# Patient Record
Sex: Male | Born: 1980
Health system: Southern US, Community
[De-identification: ages and names within clinical notes are randomized; demographics above are authoritative.]

## PROBLEM LIST (undated history)

## (undated) DIAGNOSIS — E119 Type 2 diabetes mellitus without complications: Secondary | ICD-10-CM

## (undated) DIAGNOSIS — N183 Chronic kidney disease, stage 3 unspecified: Secondary | ICD-10-CM

## (undated) DIAGNOSIS — I152 Hypertension secondary to endocrine disorders: Secondary | ICD-10-CM

## (undated) DIAGNOSIS — E1159 Type 2 diabetes mellitus with other circulatory complications: Secondary | ICD-10-CM

## (undated) DIAGNOSIS — I10 Essential (primary) hypertension: Secondary | ICD-10-CM

---

## 2009-03-22 ENCOUNTER — Observation Stay: Payer: Self-pay | Admitting: Internal Medicine

## 2009-11-06 ENCOUNTER — Emergency Department: Payer: Self-pay | Admitting: Emergency Medicine

## 2012-07-24 ENCOUNTER — Inpatient Hospital Stay: Payer: Self-pay | Admitting: Internal Medicine

## 2012-07-24 LAB — URINALYSIS, COMPLETE
Bacteria: NONE SEEN
Leukocyte Esterase: NEGATIVE
Nitrite: NEGATIVE
Protein: >=500
Specific Gravity: 1.029 (ref 1.003–1.030)
WBC UR: 2 /HPF (ref 0–5)

## 2012-07-24 LAB — CBC
HCT: 42.7 % (ref 40.0–52.0)
HGB: 14.9 g/dL (ref 13.0–18.0)
MCV: 70 fL — ABNORMAL LOW (ref 80–100)
RDW: 16.4 % — ABNORMAL HIGH (ref 11.5–14.5)
WBC: 9 10*3/uL (ref 3.8–10.6)

## 2012-07-24 LAB — COMPREHENSIVE METABOLIC PANEL
Alkaline Phosphatase: 101 U/L (ref 50–136)
Anion Gap: 14 (ref 7–16)
BUN: 27 mg/dL — ABNORMAL HIGH (ref 7–18)
Calcium, Total: 9.7 mg/dL (ref 8.5–10.1)
Creatinine: 1.93 mg/dL — ABNORMAL HIGH (ref 0.60–1.30)
EGFR (African American): 53 — ABNORMAL LOW
EGFR (Non-African Amer.): 45 — ABNORMAL LOW
Glucose: 514 mg/dL (ref 65–99)
Osmolality: 300 (ref 275–301)
SGPT (ALT): 56 U/L (ref 12–78)
Sodium: 136 mmol/L (ref 136–145)
Total Protein: 9.2 g/dL — ABNORMAL HIGH (ref 6.4–8.2)

## 2012-07-24 LAB — TROPONIN I
Troponin-I: 0.11 ng/mL — ABNORMAL HIGH
Troponin-I: 0.13 ng/mL — ABNORMAL HIGH

## 2012-07-24 LAB — LIPASE, BLOOD: Lipase: 226 U/L (ref 73–393)

## 2012-07-25 LAB — CBC WITH DIFFERENTIAL/PLATELET
Basophil #: 0.1 10*3/uL (ref 0.0–0.1)
Eosinophil #: 0.2 10*3/uL (ref 0.0–0.7)
Eosinophil %: 2 %
HCT: 37.5 % — ABNORMAL LOW (ref 40.0–52.0)
Lymphocyte #: 2.5 10*3/uL (ref 1.0–3.6)
Lymphocyte %: 26.2 %
Monocyte %: 6 %
Neutrophil %: 65.2 %
Platelet: 173 10*3/uL (ref 150–440)
RBC: 5.32 10*6/uL (ref 4.40–5.90)
WBC: 9.7 10*3/uL (ref 3.8–10.6)

## 2012-07-25 LAB — BASIC METABOLIC PANEL
Anion Gap: 14 (ref 7–16)
BUN: 28 mg/dL — ABNORMAL HIGH (ref 7–18)
Co2: 22 mmol/L (ref 21–32)
Creatinine: 1.92 mg/dL — ABNORMAL HIGH (ref 0.60–1.30)
EGFR (African American): 53 — ABNORMAL LOW
EGFR (Non-African Amer.): 46 — ABNORMAL LOW
Glucose: 441 mg/dL — ABNORMAL HIGH (ref 65–99)
Sodium: 139 mmol/L (ref 136–145)

## 2012-07-25 LAB — CK TOTAL AND CKMB (NOT AT ARMC)
CK, Total: 273 U/L — ABNORMAL HIGH (ref 35–232)
CK-MB: 1.6 ng/mL (ref 0.5–3.6)

## 2012-07-25 LAB — LIPID PANEL
Cholesterol: 228 mg/dL — ABNORMAL HIGH (ref 0–200)
HDL Cholesterol: 23 mg/dL — ABNORMAL LOW (ref 40–60)
Triglycerides: 2425 mg/dL — ABNORMAL HIGH (ref 0–200)

## 2012-07-25 LAB — FERRITIN: Ferritin (ARMC): 473 ng/mL — ABNORMAL HIGH (ref 8–388)

## 2012-07-25 LAB — TROPONIN I: Troponin-I: 0.14 ng/mL — ABNORMAL HIGH

## 2012-07-25 LAB — HEMOGLOBIN A1C: Hemoglobin A1C: 11.7 % — ABNORMAL HIGH (ref 4.2–6.3)

## 2012-07-25 LAB — IRON: Iron: 45 ug/dL — ABNORMAL LOW (ref 65–175)

## 2012-07-26 LAB — BASIC METABOLIC PANEL
BUN: 20 mg/dL — ABNORMAL HIGH (ref 7–18)
Calcium, Total: 9.1 mg/dL (ref 8.5–10.1)
Chloride: 106 mmol/L (ref 98–107)
Creatinine: 1.53 mg/dL — ABNORMAL HIGH (ref 0.60–1.30)
EGFR (African American): >60
EGFR (Non-African Amer.): >60
Glucose: 352 mg/dL — ABNORMAL HIGH (ref 65–99)
Potassium: 4.2 mmol/L (ref 3.5–5.1)
Sodium: 142 mmol/L (ref 136–145)

## 2012-07-27 LAB — BASIC METABOLIC PANEL
Anion Gap: 10 (ref 7–16)
BUN: 19 mg/dL — ABNORMAL HIGH (ref 7–18)
Calcium, Total: 8.5 mg/dL (ref 8.5–10.1)
Chloride: 106 mmol/L (ref 98–107)
Co2: 25 mmol/L (ref 21–32)
EGFR (African American): >60
Sodium: 141 mmol/L (ref 136–145)

## 2013-09-13 ENCOUNTER — Ambulatory Visit: Payer: Self-pay | Admitting: Internal Medicine

## 2014-09-16 DIAGNOSIS — E1165 Type 2 diabetes mellitus with hyperglycemia: Secondary | ICD-10-CM

## 2014-09-16 DIAGNOSIS — IMO0002 Reserved for concepts with insufficient information to code with codable children: Secondary | ICD-10-CM | POA: Insufficient documentation

## 2014-09-16 DIAGNOSIS — E1129 Type 2 diabetes mellitus with other diabetic kidney complication: Secondary | ICD-10-CM | POA: Insufficient documentation

## 2014-09-16 DIAGNOSIS — Z794 Long term (current) use of insulin: Secondary | ICD-10-CM | POA: Insufficient documentation

## 2014-09-27 IMAGING — US US RENAL KIDNEY
1 series · 14 of 25 positions shown · non-contrast
Comparison: none

REASON FOR EXAM: flank pain renal insufficiency
COMMENTS:

PROCEDURE:     US  - US KIDNEY  - September 13, 2013 [DATE]
RESULT:     Bilateral renal ultrasound dated 09/13/2013
TECHNIQUE: Grayscale, duplex color flow Doppler real-time sonographic
imaging obtained with image documentation.

[Series 1: us renal kidney · 0.27mm/px · 14 of 32 slices shown]
[im 1/32]
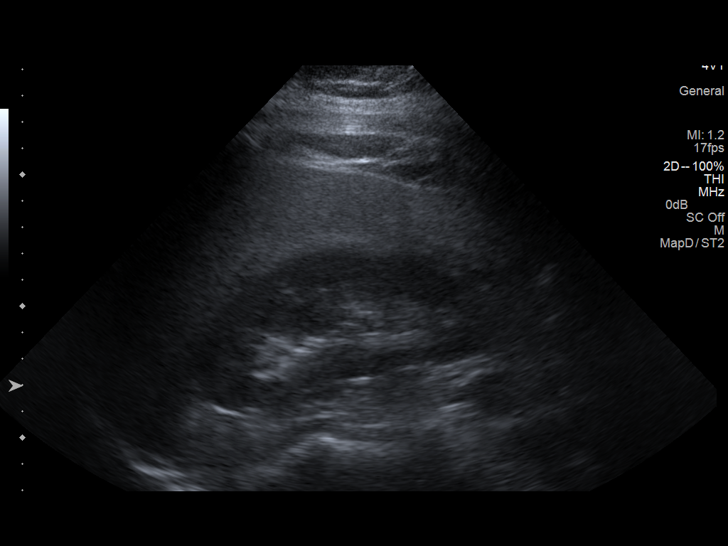
[im 3/32]
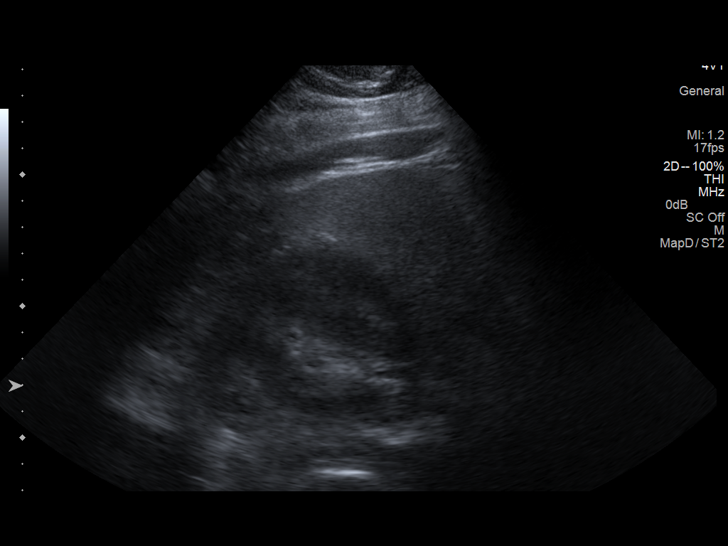
[im 6/32]
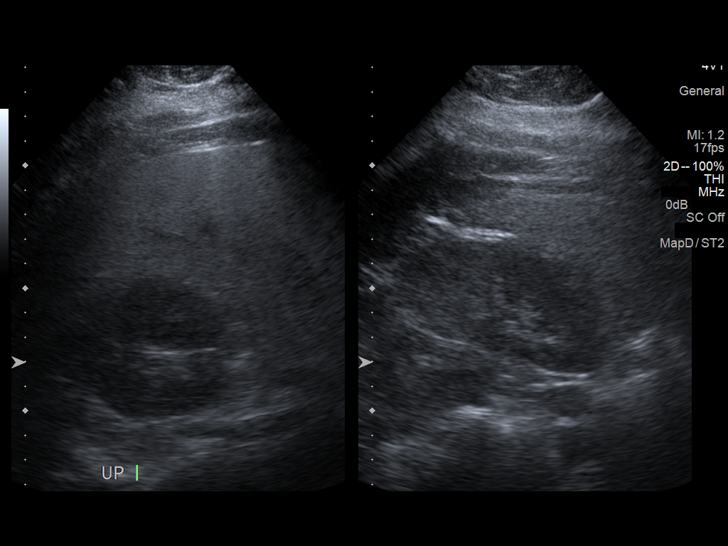
[im 8/32]
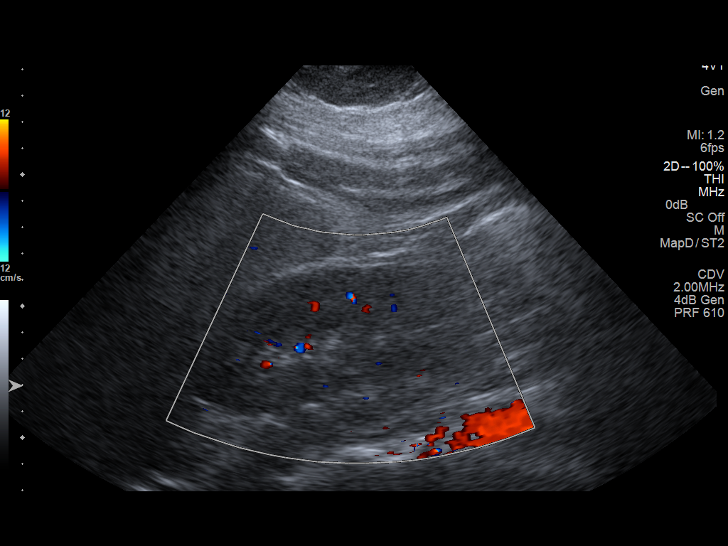
[im 11/32]
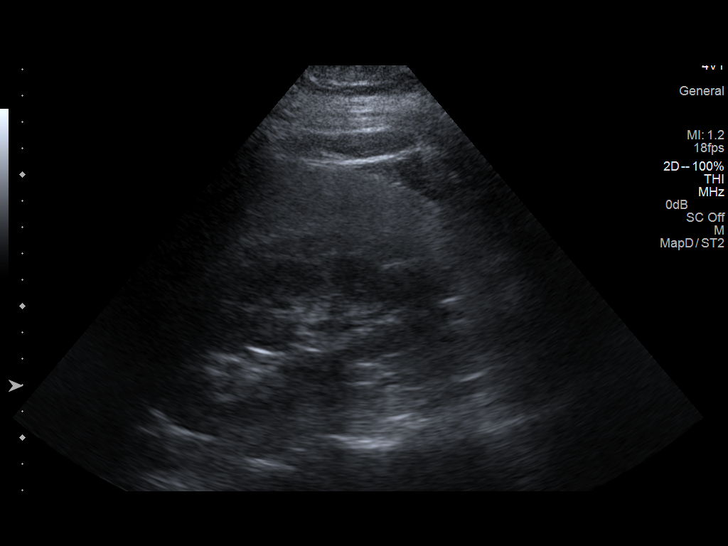
[im 12/32]
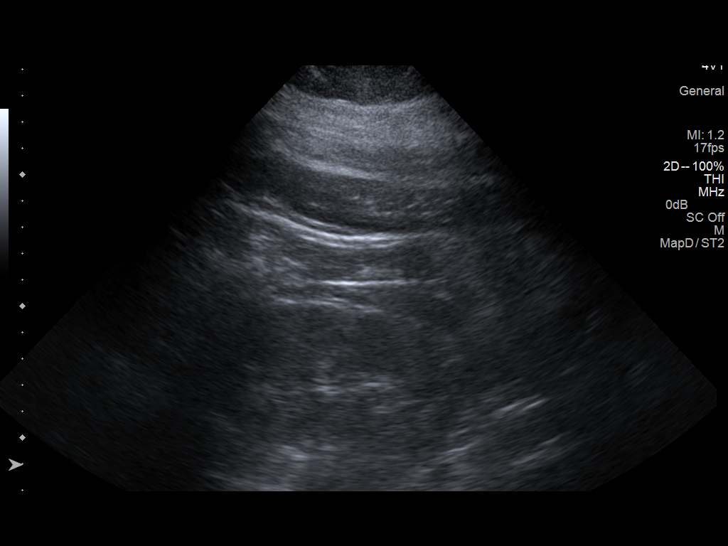
[im 15/32]
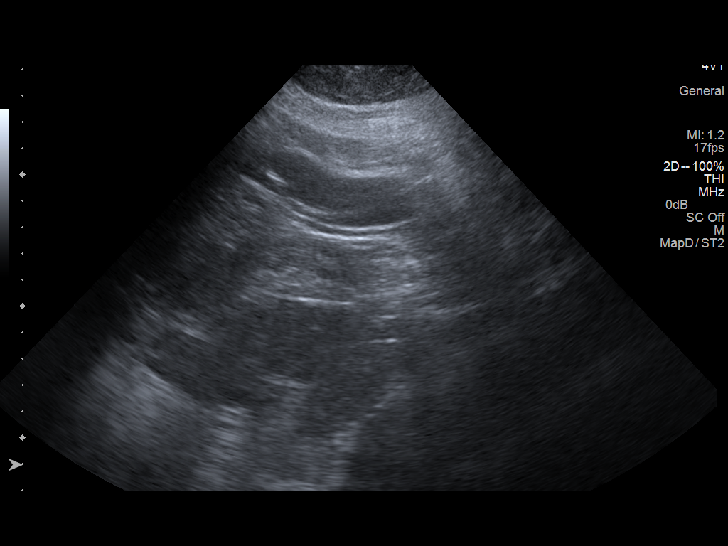
[im 17/32]
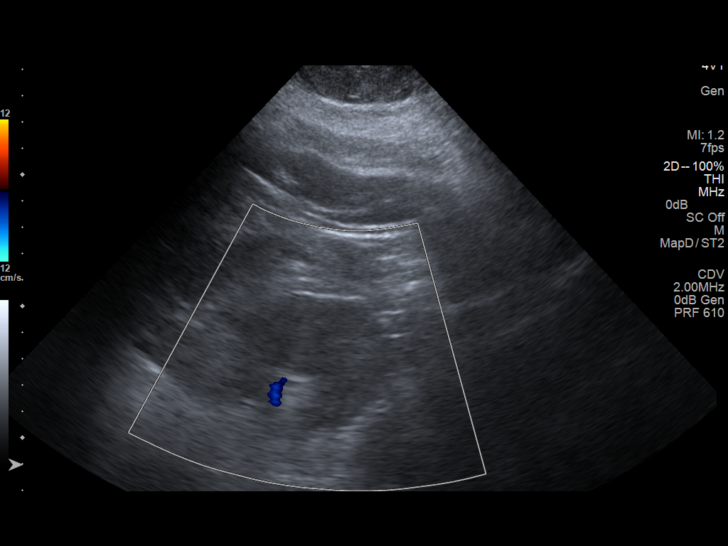
[im 20/32]
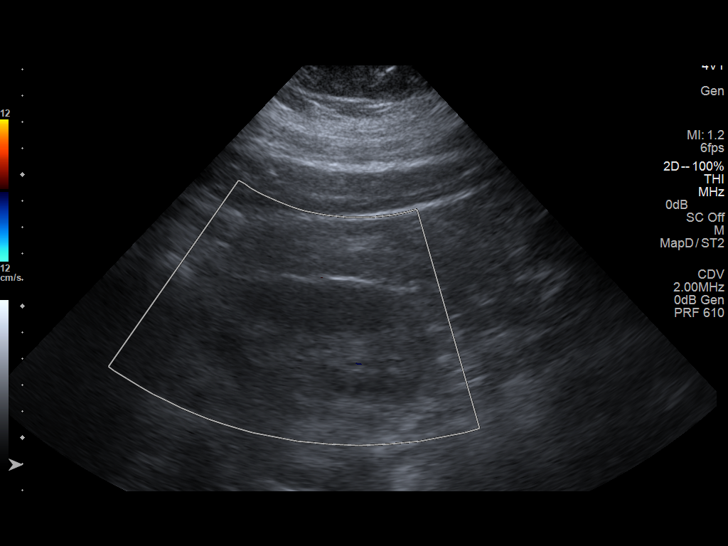
[im 21/32]
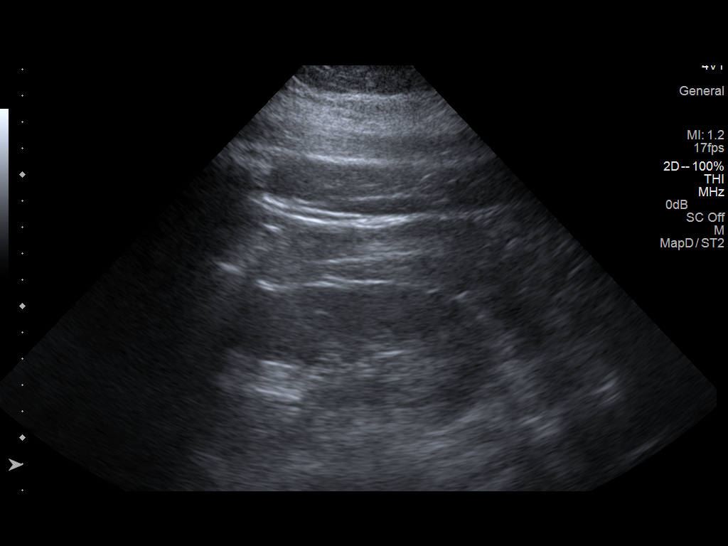
[im 24/32]
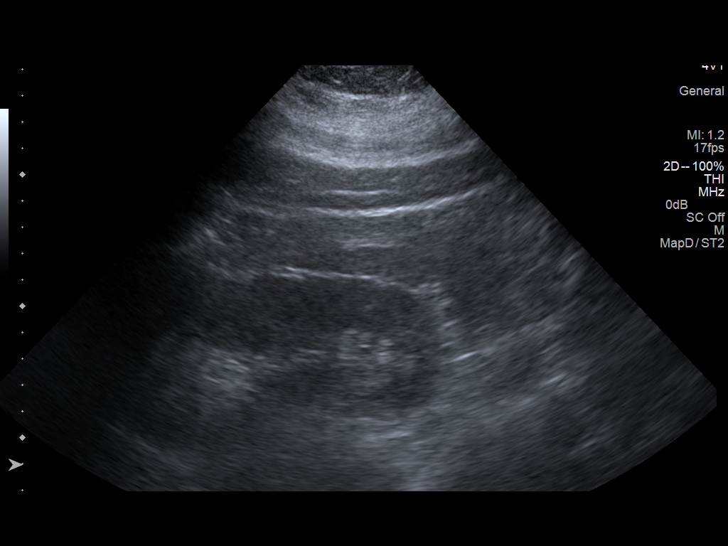
[im 26/32]
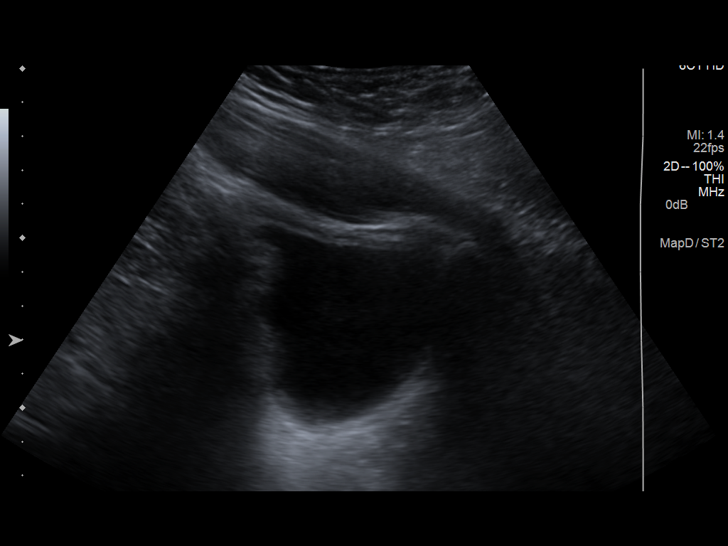
[im 29/32]
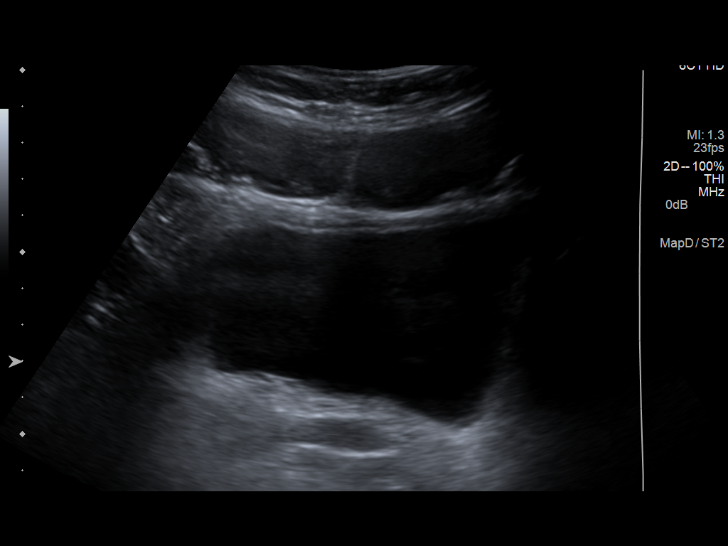
[im 32/32]
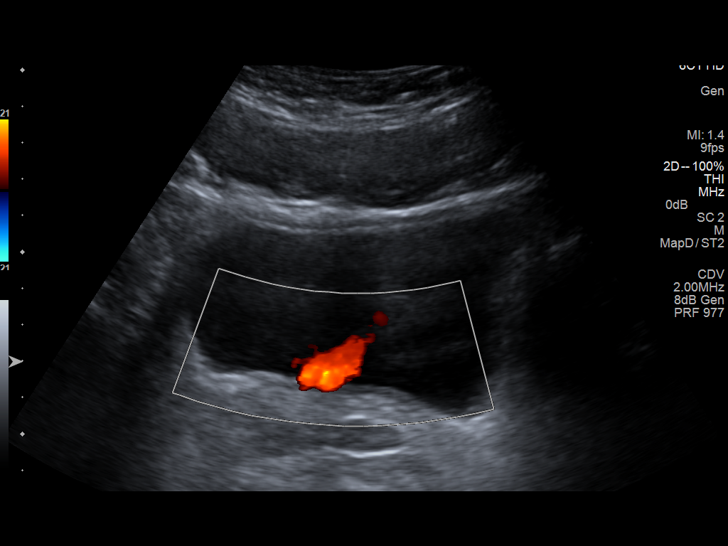

[14 of 25 positions shown; findings below may reference images not displayed]

FINDINGS: The right kidney measures 11.8 x 6.6 x 6.4 cm and the left 10.3 x
5.4 x 5.5 cm. There is appropriate cortical medullary differentiation
without evidence of solid or cystic masses, calculi, or hydronephrosis.
Bilateral ureteral jets are demonstrated.
IMPRESSION: Unremarkable renal ultrasound.

## 2015-02-28 NOTE — Consult Note (Signed)
PATIENT NAME:  Bobby Montgomery, Bobby Montgomery MR#:  [id number] DATE OF BIRTH:  23-Jul-1981  DATE OF CONSULTATION:  07/27/2012  REFERRING PHYSICIAN: Fulton Reek, MD   CONSULTING PHYSICIAN:  A. Lavone Orn, MD  CHIEF COMPLAINT: Uncontrolled diabetes.    HISTORY OF PRESENT ILLNESS: The patient is a 34 year old male who was admitted on 07/24/2012 with nausea and severe hyperglycemia. He was found to have an initial blood sugar over 500 as well as acute renal failure with a creatinine of 1.98, up from a baseline value of 1.6. He had been diagnosed with diabetes in April 2013 and had been taking Januvia 100 mg daily. Upon hospitalization, he was initiated on basal insulin with Lantus as well as a NovoLog sliding scale. He was given IV fluids for his renal failure. His blood sugars initially remained high, however, have improved in the last 48 hours. Blood sugars at this time are in the range of 290 to 325. His current diabetes regimen is Levemir 50 units at bedtime and glimepiride 4 mg daily and NovoLog insulin sliding scale. He continues to have renal insufficiency, and his creatinine has decreased to baseline at 1.6 (eGFR greater than 60).   PAST MEDICAL HISTORY:  1. Morbid obesity (BMI 48). 2. Diabetes mellitus, diagnosis April 2013.  3. Hypertension.  4. Obstructive sleep apnea.   INPATIENT MEDICATIONS:  1. Levemir 50 units daily.  2. Norvasc 10 mg daily.  3. HCTZ 12.5 mg daily.  4. Toprol-XL 100 mg b.i.d.  5. Pantoprazole 40 mg daily.  6. Diovan 320 mg daily.  7. Aspirin 81 mg daily.  8. Heparin subcutaneous 5000 units every 12 hours.  9. Glimepiride 4 mg daily.  10. NovoLog sliding scale before meals and at bedtime.   FAMILY HISTORY: Not known, the patient is adopted.   SOCIAL HISTORY: The patient is married and has one child. He is not employed. He denies alcohol and tobacco use.    REVIEW OF SYSTEMS: HEENT: No blurred vision. No sore throat. NECK: No neck pain. No dysphagia. CHEST: No chest  pain or palpitation. PULMONARY: No cough or shortness of breath. ABDOMEN: No abdominal pain. Nausea has resolved. Appetite is good. EXTREMITIES: Denies leg swelling. SKIN: Denies rash or pruritus. ENDOCRINE: Denies heat or cold intolerance. NEUROLOGIC: Denies headaches or falls. HEMATOLOGIC: Denies easy bruisability or recent bleeding.   PHYSICAL EXAMINATION:  VITAL SIGNS: Temperature 97.9, pulse 83, respirations 20, blood pressure 143/86, pulse oximetry 98% on room air. Height 75 inches, weight 390 pounds, BMI 48.8.   GENERAL: Morbidly obese African American male in no acute distress.   HEENT: Extraocular movements are intact. Oropharynx is clear. Mucous membranes are moist.   NECK: Supple. No appreciable thyromegaly.   CARDIAC: Regular rate and rhythm. No carotid bruit.   LUNGS: Clear to auscultation bilaterally. No wheeze or rhonchi.   ABDOMEN: Diffusely soft, nontender, nondistended.   SKIN: No rash. Acanthosis nigricans is present on the neck.   EXTREMITIES: No edema is present. Normal motor tone.   NEUROLOGIC: No sensory deficits appreciated. Normal strength throughout.   PSYCHIATRIC: Alert and oriented x3, calm, and cooperative.   LABORATORY DATA: Glucose 316, creatinine 1.61, BUN 19, potassium 3.7, eGFR greater than 60, calcium 8.5, hematocrit 37.2.    ASSESSMENT: 34 year old male with morbid obesity, hypertension, obstructive sleep apnea, and uncontrolled type 2 diabetes.   RECOMMENDATIONS:  1. Recommending switching to a b.i.d. mixed insulin. We will start NovoLog 70/30 mix 50 units b.i.d. at meals. This regimen should give better 24-hour control than  a once daily basal dose. 2. He was advised to monitor blood sugars at least twice daily. He assured me he has a glucometer and is knowledgeable about how to use it.  3. He would benefit from weight loss and a low carbohydrate diet.  4. He would benefit from diabetes education, this can be arranged as an outpatient.  5. We  will arrange clinic follow-up in one week.  6. Okay to discharge from a diabetes standpoint. He will need prescriptions for NovoLog 70/30 (vial) as well as insulin syringes.   Thank you for the kind request for consultation.  ____________________________ A. Lavone Orn, MD ams:cbb D: 07/27/2012 14:18:13 ET T: 07/27/2012 15:02:25 ET JOB#: [id number]  cc: A. Lavone Orn, MD, <Dictator> Sherlon Handing MD ELECTRONICALLY SIGNED 07/27/2012 16:11

## 2015-02-28 NOTE — Consult Note (Signed)
Chief Complaint and History:   Referring Physician Dr. Judithann SheenSparks    Chief Complaint Uncontrolled diabetes   Allergies:  No Known Allergies:   Assessment/Plan:   Assessment/Plan 34 yo M diagnosed with diabetes in 02/2012 admitted with severe hyperglycemia, sugars >500, without ketoacidosis, and acute renal failure. He is now on Levemir 50 units qHS and sugars today are in the 290-325 range. He was seen, examined, and chart was reviewed.  A/ 1. Uncontrolled type 2 diabetes 2. Morbid obesity  P/ 1. Will DC the Levemir and replace with NovoLog Mix (VIAL) 50 units sq bid to be taken before breakfast and supper. 2. Needs diabetes education which can be arranged as an out-patient. 3. Counseled pt to check his sugar bid. 4. Will arrange out patient follow-up next week. Patient to bring glucometer to clinic appointments. OK to DC home from my standpoint with rxn for NovoLog 70/30 mix VIAL and insulin syringes.  Full consult to be dictated.   Electronic Signatures: Raj JanusSolum, Yehoshua Vitelli M (MD)  (Signed 16-Sep-13 14:08)  Authored: Chief Complaint and History, ALLERGIES, Assessment/Plan   Last Updated: 16-Sep-13 14:08 by Raj JanusSolum, Krystan Northrop M (MD)

## 2015-02-28 NOTE — H&P (Signed)
PATIENT NAME:  Bobby Montgomery, Christoph MR#:  [id number] DATE OF BIRTH:  05/13/1981  DATE OF ADMISSION:  07/24/2012  PRIMARY CARE PHYSICIAN: Aram BeechamJeffrey Sparks, M.D.   History obtained from patient, old records have been reviewed. Imaging studies and EKG personally reviewed. The case was discussed with the ER physician Dr. Dorothea GlassmanPaul Malinda.   CHIEF COMPLAINT: Nausea and chest pain.   HISTORY OF PRESENTING ILLNESS: The patient is a 34 year old male patient with history of diabetes mellitus, hypertension, morbid obesity, and obstructive sleep apnea who presents to the Emergency Room complaining of two days of nausea and uncontrolled blood sugars. The patient's blood sugars were greater than 500 on arrival to the Emergency Room and he received 10 units of insulin again and still persistently at 468 at this time. He also has acute renal failure with creatinine of 1.98 with baseline creatinine being normal. The patient's troponin has been elevated at 0.11 with blood pressure elevated at 199/103. He had some chest pain on Wednesday, which lasted two minutes, midsternal, nonradiating, no aggravating or alleviating factors, not associated with any shortness of breath, palpitations, or blackouts. He has had decreased appetite, decreased oral intake, increased frequency of urination, which has been clear. No dysuria, diarrhea, abdominal pain, or fever. He has been compliant with his medications. He mentions that he was recently started on these medications earlier this year. He had to go in for a  recheck of his blood sugars to Dr. Judithann SheenSparks in a week.   PAST MEDICAL HISTORY:  1. Diabetes mellitus type 2  2. Hypertension.  3. Obstructive sleep apnea. 4. Morbid obesity.   REVIEW OF SYSTEMS: CONSTITUTIONAL: Complains of fatigue and weakness. EYES: No blurred vision, pain, or redness. ENT: No tinnitus, ear pain, or hearing loss. RESPIRATORY: No cough, wheeze, or hemoptysis. CARDIOVASCULAR: Complained of chest pain on Wednesday,  which has resolved. No orthopnea, edema, or arrhythmia. GI: Complains of nausea. No vomiting, diarrhea, abdominal pain, or hematemesis. GU: No dysuria or hematuria.  He has urinary frequency and polyuria. GU:  No sores or discharge. ENDOCRINE: Has polyuria. No thyroid problems. HEMATOLOGIC/LYMPHATIC: No anemia, easy bruising, bleeding. INTEGUMENT: No acne, rash, or lesions. MUSCULOSKELETAL: No low back pain or joint swelling or redness. NEUROLOGIC: No numbness, weakness, or dysarthria. PSYCH: No anxiety or depression.   HOME MEDICATIONS:  1. Amlodipine 10 mg oral once a day.  2. Lisinopril 20 mg oral once a day.  3. Catapres 0.1 mg oral 3 times a day.  4. Metoprolol succinate 25 mg oral once a day.  5. Aspirin 81 mg oral once a day.  6. The patient mentions that he is on Januvia but dosage is unknown at this time.   FAMILY HISTORY: The patient is adopted and family history could not be obtained.   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 No smoking. No alcohol. No illicit drugs.   ALLERGIES: No known drug allergies.   PHYSICAL EXAMINATION:  VITAL SIGNS: Temperature 98.1, pulse 114, blood pressure 199/103, saturating 98% on room air.   GENERAL: Morbidly obese African American male patient sitting up in bed, comfortable, conversational, cooperative with exam.   PSYCHIATRIC: Alert and oriented times three. Mood and affect appropriate. Judgment intact.   HEENT: Atraumatic, normocephalic. Oral mucosa moist and pink. External ears and nose normal. No pallor. No icterus. Pupils bilaterally equal and reactive to light.   NECK: Supple. No thyromegaly. No palpable lymph nodes. Trachea midline. No carotid bruit or JVD.   CARDIOVASCULAR:  S1, S2, tachycardic without any murmurs. Peripheral pulses 2+. No edema.   RESPIRATORY: Normal work of breathing. Clear to auscultation on both sides.   GI: Soft abdomen, nontender. Bowel sounds present. No  hepatosplenomegaly palpable. Distended secondary to obesity.   SKIN: Warm and dry. No petechiae, rash, or ulcers.   MUSCULOSKELETAL: No joint swelling, redness, or effusion of the large joints. No spinal tenderness.   NEUROLOGICAL: Motor strength five out of five in upper and lower extremities. Sensation to fine touch intact all over. Cranial nerves II-XII intact.   LYMPHATIC: No cervical lymphadenopathy.   LABORATORY, DIAGNOSTIC, AND RADIOLOGICAL DATA: Laboratory studies show glucose of 503, 504, 503, and 478. BUN 27, creatinine 1.93, sodium 136, potassium 3.9, GFR of 43. AST, ALT, and alkaline phosphatase normal. Troponin 0.11, WBC 9, hemoglobin 14.9, platelets 197. MCV 70. Urinalysis shows 1+ ketones, bicarbonate 25, anion gap normal at 14. EKG shows normal sinus rhythm, left atrial abnormality, tachycardia. No acute ST-T wave elevation. No acute changes compared to prior EKG.   ASSESSMENT AND PLAN:  1. Elevated troponin of 0.11 with brief chest pain of two minutes on Wednesday. The patient does have hypertension and diabetes as risk factors, although he is young. We will check a CK to start with to see if the patient has any concomitant rhabdomyolysis which could be causing the elevated troponin. EKG has no changes. The patient is presently chest pain free. Two more sets of cardiac enzymes. The patient is on a beta blocker and aspirin which will be continued.  2. Acute renal failure secondary to uncontrolled diabetes, decreased oral intake, and increased diuresis from the glucose. We will start the patient on IV fluid resuscitation and follow the acute renal failure.  3. Hypertensive urgency or emergency. If the patient does have NSTEMI this would be emergency. We will have to hold lisinopril as the patient has acute renal failure. We will continue the amlodipine and metoprolol along with clonidine the patient is on. The patient did miss his pills secondary to nausea. We will start him on IV  medications p.r.n. to control his blood pressure. His blood pressure medication needs to be titrated up if blood pressure is still uncontrolled during the hospital stay.  4. Uncontrolled diabetes mellitus. The patient has a recent diagnosis of diabetes. He is on oral medications which are unknown at this time. With his blood sugars being so high he will need insulin. We will start him on Lantus 20 units at this time along with sliding scale. The Lantus can be titrated up. The patient will benefit from metformin but we will have to hold off at this time secondary to acute renal failure. The patient will also be referred for diabetes education.  5. Deep vein thrombosis prophylaxis with heparin.  6. CODE STATUS: FULL CODE.   TIME SPENT: Time spent today on this case was 65 minutes with more than 50% of the time spent in coordination of care.    ____________________________ Molinda Bailiff. Keana Dueitt, MD srs:bjt D: 07/24/2012 14:33:03 ET T: 07/24/2012 15:13:04 ET JOB#: [id number]  cc: Wardell Heath R. Doil Kamara, MD, <Dictator> Duane Lope. Judithann Sheen, MD Orie Fisherman MD ELECTRONICALLY SIGNED 07/24/2012 23:02

## 2015-02-28 NOTE — Discharge Summary (Signed)
PATIENT NAME:  Bobby Montgomery, [name]:  [id number] DATE OF BIRTH:  May 10, 1981  DATE OF ADMISSION:  07/24/2012 DATE OF DISCHARGE:  07/27/2012  REASON FOR ADMISSION: Nausea with chest pain.   HISTORY OF PRESENT ILLNESS: Please see the dictated history of present illness done by Dr. Elpidio AnisSudini on 07/24/2012.  PAST MEDICAL HISTORY:  1. Morbid obesity.  2. Type 2 diabetes.  3. Accelerated hypertension.  4. Obstructive sleep apnea.   MEDICATIONS ON ADMISSION: Please see admission note.   ALLERGIES: No known drug allergies.   SOCIAL HISTORY, FAMILY HISTORY AND REVIEW OF SYSTEMS: As per admission note.   PHYSICAL EXAMINATION: The patient was in no acute distress. Vital signs were remarkable for a blood pressure of 199/103 with a heart rate of 114. He was afebrile. HEENT exam was unremarkable. Neck was supple without JVD. Lungs were clear. Cardiac exam revealed a rapid rate with a regular rhythm. Normal S1 and S2. Abdomen was soft and nontender. Extremities: Trace edema. Neurologic exam was grossly nonfocal.   LABORATORY DATA: Laboratory data revealed a glucose of 503.   HOSPITAL COURSE: The patient was admitted with uncontrolled diabetes and hyperglycemia with dehydration and acute renal failure from his dehydration. He was also noted to have a mildly elevated troponin. The patient was seen in consultation by endocrinology. He was also seen in consultation by cardiology. He was felt to have elevated troponin due to demand ischemia from his accelerated hypertension, morbid obesity, and dehydration. There was no evidence of myocardial infarction. The patient was maximized on his blood pressure regimen. He was started on insulin. He was given fluids for hydration. His renal function improved. His sugars improved as well. He tolerated insulin well. Diabetic teaching took place during the hospitalization. He denied further chest pain. He was up and ambulating without difficulty. Echocardiogram done in the  hospital was unremarkable except for some moderate concentric left ventricular hypertrophy. By 07/27/2012, the patient was stable and ready for discharge.   DISCHARGE DIAGNOSES:  1. Uncontrolled diabetes with hyperglycemia.  2. Accelerated hypertension.  3. Dehydration with acute renal failure, resolved.  4. Morbid obesity.  5. Obstructive sleep apnea.   DISCHARGE MEDICATIONS:  1. Metoprolol succinate 100 mg p.o. b.i.d.  2. Aspirin 81 mg p.o. daily.  3. Colace 100 mg p.o. b.i.d.  4. Protonix 40 mg p.o. daily.  5. Ambien 5 mg p.o. at bedtime p.r.n. sleep.  6. Norvasc 10 mg p.o. daily.  7. Amaryl 4 mg p.o. daily.  8. Diovan 320 mg p.o. daily.  9. Hydrochlorothiazide 12.5 mg p.o. daily.  10. NovoLog 70/30 mix 50 units subcutaneous b.i.d.   FOLLOW-UP PLANS AND APPOINTMENTS:  1. The patient was discharged on a low sodium carbohydrate controlled ADA diet.  2. He will stop his Januvia and Tribenzor.  3. He will follow up with Dr. Tedd SiasSolum and myself in the office within one week's time, sooner if needed.   ____________________________ Duane LopeJeffrey D. Judithann SheenSparks, MD jds:ap D: 08/06/2012 09:54:18 ET T: 08/07/2012 11:04:07 ET JOB#: [id number]  cc: Duane LopeJeffrey D. Judithann SheenSparks, MD, <Dictator> Bobby Montgomery Bobby Montgomery Bobby Viereck MD ELECTRONICALLY SIGNED 08/09/2012 21:30

## 2016-12-07 ENCOUNTER — Emergency Department
Admission: EM | Admit: 2016-12-07 | Discharge: 2016-12-07 | Disposition: A | Discharge status: Home / Self Care | Payer: BLUE CROSS/BLUE SHIELD | Attending: Emergency Medicine | Admitting: Emergency Medicine

## 2016-12-07 ENCOUNTER — Encounter: Payer: Self-pay | Admitting: Emergency Medicine

## 2016-12-07 DIAGNOSIS — R05 Cough: Secondary | ICD-10-CM | POA: Diagnosis present

## 2016-12-07 DIAGNOSIS — E119 Type 2 diabetes mellitus without complications: Secondary | ICD-10-CM | POA: Insufficient documentation

## 2016-12-07 DIAGNOSIS — I10 Essential (primary) hypertension: Secondary | ICD-10-CM | POA: Diagnosis not present

## 2016-12-07 DIAGNOSIS — J111 Influenza due to unidentified influenza virus with other respiratory manifestations: Secondary | ICD-10-CM | POA: Insufficient documentation

## 2016-12-07 HISTORY — DX: Type 2 diabetes mellitus without complications: E11.9

## 2016-12-07 HISTORY — DX: Essential (primary) hypertension: I10

## 2016-12-07 MED ORDER — FLUTICASONE PROPIONATE 50 MCG/ACT NA SUSP: 2.0000 | Freq: Every day | NASAL | 0 refills | Status: AC

## 2016-12-07 MED ORDER — ACETAMINOPHEN 325 MG PO TABS
650.0000 mg | ORAL_TABLET | Freq: Once | ORAL | Status: AC
  Administered 2016-12-07: 650 mg via ORAL
  Filled 2016-12-07: qty 2

## 2016-12-07 MED ORDER — PROMETHAZINE-DM 6.25-15 MG/5ML PO SYRP: 5.0000 mL | ORAL_SOLUTION | Freq: Four times a day (QID) | ORAL | 0 refills | Status: AC | PRN

## 2016-12-07 MED ORDER — OSELTAMIVIR PHOSPHATE 75 MG PO CAPS: 75.0000 mg | ORAL_CAPSULE | Freq: Two times a day (BID) | ORAL | 0 refills | Status: DC

## 2016-12-07 NOTE — ED Provider Notes (Signed)
[facility] Emergency Department Provider Note  ____________________________________________  Time seen: Approximately 4:55 PM  I have reviewed the triage vital signs and the nursing notes.   HISTORY  Chief Complaint Cough    HPI Bobby Montgomery is a 36 y.o. male , NAD, presents to the emergency department for evaluation of 2 day history of flulike symptoms. Patient states he began to feel ill day before yesterday and notes worsening of symptoms today. Has had fevers, chills, body aches. Has also had a mild cough with chest congestion, nasal congestion and runny nose. Denies sore throat, sinus pressure, ear pain. Has had no abdominal pain, vomiting or diarrhea but does note mild nausea this morning. Has had a decreased appetite as well. Has been able tolerate fluids by mouth without any episodes of nausea or emesis. States his daughter was ill with flu last week and responded well to Tamiflu. Patient currently took a dose of NyQuil last night but no other medications since that time.   Past Medical History:  Diagnosis Date  . Diabetes mellitus without complication (HCC)   . Hypertension     There are no active problems to display for this patient.   History reviewed. No pertinent surgical history.  Prior to Admission medications   Medication Sig Start Date End Date Taking? Authorizing Provider  fluticasone (FLONASE) 50 MCG/ACT nasal spray Place 2 sprays into both nostrils daily. 12/07/16   Sampson Self L Edsel Shives, PA-C  oseltamivir (TAMIFLU) 75 MG capsule Take 1 capsule (75 mg total) by mouth 2 (two) times daily. 12/07/16   Hulen Mandler L Cayla Wiegand, PA-C  promethazine-dextromethorphan (PROMETHAZINE-DM) 6.25-15 MG/5ML syrup Take 5 mLs by mouth 4 (four) times daily as needed for cough. 12/07/16   Zaylon Bossier L Alessandria Henken, PA-C    Allergies Patient has no known allergies.  History reviewed. No pertinent family history.  Social History Social History  Substance Use Topics  . Smoking  status: Never Smoker  . Smokeless tobacco: Never Used  . Alcohol use No     Review of Systems  Constitutional: Positive fever, chills, fatigue, decreased appetite. ENT: Positive nasal congestion, runny nose. No sore throat, sinus pressure, ear pain. Cardiovascular: No chest pain. Respiratory: Positive cough and chest congestion. No shortness of breath. No wheezing.  Gastrointestinal: Positive nausea without vomiting. No abdominal pain.   No diarrhea. Musculoskeletal: Negative for back pain.  Skin: Negative for rash. Neurological: Negative for headaches, focal weakness or numbness. 10-point ROS otherwise negative.  ____________________________________________   PHYSICAL EXAM:  VITAL SIGNS: ED Triage Vitals  Enc Vitals Group     BP 12/07/16 1621 (!) 143/91     Pulse Rate 12/07/16 1621 84     Resp 12/07/16 1621 20     Temp 12/07/16 1621 (!) 100.5 F (38.1 C)     Temp Source 12/07/16 1621 Oral     SpO2 12/07/16 1621 96 %     Weight 12/07/16 1622 (!) 380 lb (172.4 kg)     Height 12/07/16 1622 6\' 2"  (1.88 m)     Head Circumference --      Peak Flow --      Pain Score --      Pain Loc --      Pain Edu? --      Excl. in GC? --      Constitutional: Alert and oriented. Well appearing and in no acute distress. Eyes: Conjunctivae are normal without icterus, injection or discharge. Head: Atraumatic. ENT:      Ears: TMs  visual loss bilaterally with mild serous effusion but no bulging, erythema or perforation.      Nose: Mild congestion with trace clear rhinorrhea. Bilateral turbinates are injected with mild edema.      Mouth/Throat: Mucous membranes are moist. Pharynx without erythema, swelling, exudate. Uvula is midline. Airway is patent. Clear postnasal drainage. Neck: Supple with full range of motion. Hematological/Lymphatic/Immunilogical: No cervical lymphadenopathy. Cardiovascular: Normal rate, regular rhythm. Normal S1 and S2.  Good peripheral circulation. Respiratory:  Normal respiratory effort without tachypnea or retractions. Lungs CTAB with breath sounds noted in all lung fields. No wheeze, rhonchi, rales Neurologic:  Normal speech and language. No gross focal neurologic deficits are appreciated.  Skin:  Skin is warm, dry and intact. No rash noted. Psychiatric: Mood and affect are normal. Speech and behavior are normal. Patient exhibits appropriate insight and judgement.   ____________________________________________   LABS  None ____________________________________________  EKG  None ____________________________________________  RADIOLOGY  None ____________________________________________    PROCEDURES  Procedure(s) performed: None   Procedures   Medications  acetaminophen (TYLENOL) tablet 650 mg (650 mg Oral Given 12/07/16 1710)     ____________________________________________   INITIAL IMPRESSION / ASSESSMENT AND PLAN / ED COURSE  Pertinent labs & imaging results that were available during my care of the patient were reviewed by me and considered in my medical decision making (see chart for details).     Patient's diagnosis is consistent with Influenza due to close contact with family member with positive influenza testing and physical exam and history suggest clinical diagnosis of influenza. Patient was given acetaminophen while in the emergency department to help with body aches and low-grade temperature. Patient will be discharged home with prescriptions for Flonase, Tamiflu and promethazine DM to take as directed.  Patient is to follow up with [facility] if symptoms persist past this treatment course. Patient is given ED precautions to return to the ED for any worsening or new symptoms.   ____________________________________________  FINAL CLINICAL IMPRESSION(S) / ED DIAGNOSES  Final diagnoses:  Influenza      NEW MEDICATIONS STARTED DURING THIS VISIT:  Discharge Medication List as of 12/07/2016  5:22  PM    START taking these medications   Details  fluticasone (FLONASE) 50 MCG/ACT nasal spray Place 2 sprays into both nostrils daily., Starting Sat 12/07/2016, Print    oseltamivir (TAMIFLU) 75 MG capsule Take 1 capsule (75 mg total) by mouth 2 (two) times daily., Starting Sat 12/07/2016, Print    promethazine-dextromethorphan (PROMETHAZINE-DM) 6.25-15 MG/5ML syrup Take 5 mLs by mouth 4 (four) times daily as needed for cough., Starting Sat 12/07/2016, Print             Ernestene Kiel Pitkin, PA-C 12/07/16 1731    Emily Filbert, MD 12/07/16 [phone]

## 2016-12-07 NOTE — Discharge Instructions (Signed)
Rest. Push fluids like water and gatorade. Bland diet as tolerated.   Alternate Tylenol and Motrin as needed.

## 2016-12-07 NOTE — ED Triage Notes (Signed)
Pt reports cough and chest congestion, fevers and body aches for the past 2 days.  No meds given. No flu shot. Has had sick contacts

## 2016-12-07 NOTE — ED Notes (Signed)
Pt verbalized understanding of discharge instructions. NAD at this time. 

## 2017-04-24 DIAGNOSIS — N183 Chronic kidney disease, stage 3 unspecified: Secondary | ICD-10-CM | POA: Insufficient documentation

## 2017-04-24 DIAGNOSIS — E1169 Type 2 diabetes mellitus with other specified complication: Secondary | ICD-10-CM | POA: Insufficient documentation

## 2017-04-24 DIAGNOSIS — E669 Obesity, unspecified: Secondary | ICD-10-CM

## 2017-04-24 DIAGNOSIS — I10 Essential (primary) hypertension: Secondary | ICD-10-CM | POA: Insufficient documentation

## 2017-04-24 DIAGNOSIS — K219 Gastro-esophageal reflux disease without esophagitis: Secondary | ICD-10-CM | POA: Insufficient documentation

## 2017-05-01 ENCOUNTER — Ambulatory Visit: Payer: BLUE CROSS/BLUE SHIELD | Attending: Internal Medicine

## 2017-05-01 DIAGNOSIS — Z6841 Body Mass Index (BMI) 40.0 and over, adult: Secondary | ICD-10-CM | POA: Diagnosis not present

## 2017-05-01 DIAGNOSIS — R809 Proteinuria, unspecified: Secondary | ICD-10-CM | POA: Insufficient documentation

## 2017-05-01 DIAGNOSIS — I10 Essential (primary) hypertension: Secondary | ICD-10-CM | POA: Diagnosis not present

## 2017-05-01 DIAGNOSIS — R0683 Snoring: Secondary | ICD-10-CM | POA: Insufficient documentation

## 2017-05-28 ENCOUNTER — Emergency Department
Admission: EM | Admit: 2017-05-28 | Discharge: 2017-05-28 | Disposition: A | Discharge status: Home / Self Care | Payer: BLUE CROSS/BLUE SHIELD | Attending: Emergency Medicine | Admitting: Emergency Medicine

## 2017-05-28 ENCOUNTER — Encounter: Payer: Self-pay | Admitting: Emergency Medicine

## 2017-05-28 DIAGNOSIS — Z79899 Other long term (current) drug therapy: Secondary | ICD-10-CM | POA: Insufficient documentation

## 2017-05-28 DIAGNOSIS — I10 Essential (primary) hypertension: Secondary | ICD-10-CM | POA: Diagnosis not present

## 2017-05-28 DIAGNOSIS — L600 Ingrowing nail: Secondary | ICD-10-CM | POA: Diagnosis not present

## 2017-05-28 DIAGNOSIS — E119 Type 2 diabetes mellitus without complications: Secondary | ICD-10-CM | POA: Diagnosis not present

## 2017-05-28 DIAGNOSIS — M79674 Pain in right toe(s): Secondary | ICD-10-CM | POA: Diagnosis present

## 2017-05-28 MED ORDER — CLINDAMYCIN HCL 300 MG PO CAPS: 300.0000 mg | ORAL_CAPSULE | Freq: Three times a day (TID) | ORAL | 0 refills | Status: AC

## 2017-05-28 NOTE — ED Triage Notes (Signed)
Presents with pain to right great toe area  Denies any injury but thinks it is an ingrown nail

## 2017-05-28 NOTE — ED Provider Notes (Signed)
[facility] Emergency Department Provider Note   ____________________________________________   I have reviewed the triage vital signs and the nursing notes.   HISTORY  Chief Complaint Toe Pain    HPI Bobby Montgomery is a 36 y.o. male presented to emergency department with right great toe pain related to ingrown toenail. Patient reports that her nail has been worsening over the last several weeks and now has become more painful with walking and general mobility activities. He notes pain with increased swelling and appears infected. Patient is a long-time diabetic and his blood sugar is well-maintained. He denies history of neuropathy. Patient denies fever, chills, headache, vision changes, chest pain, chest tightness, shortness of breath, abdominal pain, nausea and vomiting.  Past Medical History:  Diagnosis Date  . Diabetes mellitus without complication (HCC)   . Hypertension     There are no active problems to display for this patient.   History reviewed. No pertinent surgical history.  Prior to Admission medications   Medication Sig Start Date End Date Taking? Authorizing Provider  clindamycin (CLEOCIN) 300 MG capsule Take 1 capsule (300 mg total) by mouth 3 (three) times daily. 05/28/17 06/07/17  Terese Heier M, PA-C  fluticasone (FLONASE) 50 MCG/ACT nasal spray Place 2 sprays into both nostrils daily. 12/07/16   Hagler, Jami L, PA-C  oseltamivir (TAMIFLU) 75 MG capsule Take 1 capsule (75 mg total) by mouth 2 (two) times daily. 12/07/16   Hagler, Jami L, PA-C  promethazine-dextromethorphan (PROMETHAZINE-DM) 6.25-15 MG/5ML syrup Take 5 mLs by mouth 4 (four) times daily as needed for cough. 12/07/16   Hagler, Jami L, PA-C    Allergies Patient has no known allergies.  No family history on file.  Social History Social History  Substance Use Topics  . Smoking status: Never Smoker  . Smokeless tobacco: Never Used  . Alcohol use No    Review of  Systems Constitutional: Negative for fever/chills Eyes: No visual changes. ENT:  Negative for sore throat and for difficulty swallowing Cardiovascular: Denies chest pain. Respiratory: Denies cough. Denies shortness of breath. Gastrointestinal: No abdominal pain.  No nausea, vomiting, diarrhea. Genitourinary: Negative for dysuria. Musculoskeletal: Negative for back pain. Skin: Negative for rash. Right great toe ingrown toenail with erythema, swelling. Neurological: Negative for headaches.  Negative focal weakness or numbness. Negative for loss of consciousness. Able to ambulate. ____________________________________________   PHYSICAL EXAM:  VITAL SIGNS: ED Triage Vitals  Enc Vitals Group     BP 05/28/17 1622 (!) 145/74     Pulse Rate 05/28/17 1622 72     Resp 05/28/17 1622 18     Temp 05/28/17 1622 99 F (37.2 C)     Temp Source 05/28/17 1622 Oral     SpO2 05/28/17 1622 96 %     Weight --      Height --      Head Circumference --      Peak Flow --      Pain Score 05/28/17 1608 3     Pain Loc --      Pain Edu? --      Excl. in GC? --     Constitutional: Alert and oriented. Well appearing and in no acute distress.  Head: Normocephalic and atraumatic. Eyes: Conjunctivae are normal. PERRL.  Cardiovascular: Normal rate, regular rhythm. Normal distal pulses. Respiratory: Normal respiratory effort.  Musculoskeletal: Nontender with normal range of motion in all extremities. Neurologic: Normal speech and language.  Skin:  Skin is warm, dry and intact.  No rash noted. Right great toe ingrown toenail with erythema, swelling, appearance of infection with pus pocket. Psychiatric: Mood and affect are normal.  ____________________________________________   LABS (all labs ordered are listed, but only abnormal results are displayed)  Labs Reviewed - No data to  display ____________________________________________  EKG None ____________________________________________  RADIOLOGY None ____________________________________________   PROCEDURES  Procedure(s) performed: no    Critical Care performed: no ____________________________________________   INITIAL IMPRESSION / ASSESSMENT AND PLAN / ED COURSE  Pertinent labs & imaging results that were available during my care of the patient were reviewed by me and considered in my medical decision making (see chart for details).  Patient presents to emergency department with right great toe pain secondary to ingrown toenail. History and physical exam are reassuring the toenail is ingrown with developing infection. Patient has a history of diabetes with ball managed blood sugar levels. Discussed with the patient that it is more appropriate for podiatry to manage the ingrown toenail to ensure appropriate wound management following the procedure. Patient and family present verbalized understanding of rationale. Patient given clindamycin for antibiotic coverage and will be provided a referral for podiatry for continued care. Patient informed of clinical course, understand medical decision-making process, and agree with plan.  Patient was advised to follow up with podiatry and was also advised to return to the emergency department for symptoms that change or worsen.     ____________________________________________   FINAL CLINICAL IMPRESSION(S) / ED DIAGNOSES  Final diagnoses:  Ingrown right big toenail       NEW MEDICATIONS STARTED DURING THIS VISIT:  Discharge Medication List as of 05/28/2017  5:48 PM    START taking these medications   Details  clindamycin (CLEOCIN) 300 MG capsule Take 1 capsule (300 mg total) by mouth 3 (three) times daily., Starting Wed 05/28/2017, Until Sat 06/07/2017, Print         Note:  This document was prepared using Dragon voice recognition software and may  include unintentional dictation errors.    Clois Comber, PA-C 05/28/17 1949    Rockne Menghini, MD 06/02/17 [phone]

## 2017-05-28 NOTE — Discharge Instructions (Signed)
Follow-up with podiatry regarding continued care of right great toe ingrown toenail. Take prescription medication as directed. You may perform Epsom salt soaks 2-3 times a day.

## 2017-05-28 NOTE — ED Notes (Signed)
Pt presents to ED with c/o pain to R great toe, pt states the thinks his toe nail is in grown. Pt states started bleeding last night. Pt is noted to have some swelling around the toe nail to R great toe at this time.

## 2017-06-02 ENCOUNTER — Ambulatory Visit (INDEPENDENT_AMBULATORY_CARE_PROVIDER_SITE_OTHER): Payer: BLUE CROSS/BLUE SHIELD | Admitting: Podiatry

## 2017-06-02 ENCOUNTER — Encounter: Payer: Self-pay | Admitting: Podiatry

## 2017-06-02 DIAGNOSIS — I10 Essential (primary) hypertension: Secondary | ICD-10-CM | POA: Insufficient documentation

## 2017-06-02 DIAGNOSIS — L600 Ingrowing nail: Secondary | ICD-10-CM | POA: Diagnosis not present

## 2017-06-02 DIAGNOSIS — M549 Dorsalgia, unspecified: Secondary | ICD-10-CM | POA: Insufficient documentation

## 2017-06-02 DIAGNOSIS — L03031 Cellulitis of right toe: Secondary | ICD-10-CM

## 2017-06-02 DIAGNOSIS — I878 Other specified disorders of veins: Secondary | ICD-10-CM | POA: Insufficient documentation

## 2017-06-02 NOTE — Progress Notes (Signed)
Subjective:     Patient ID: Bobby LaiCourtney Constancio, male   DOB: 1980-11-21, 36 y.o.   MRN: [id number]  HPI this patient presents the office with chief complaint of a painful infected ingrowing toenail, right.  He states that the nail has been painful and deformed for years but has recently become infected.  He says that he went to the emergency room and was prescribed clindamycin to take orally.  He was also told to present to  the office today for definitive evaluation and treatment of this ingrown toenail.  He says the nail is painful walking and wearing his shoes.  He says that this toenail on the right big toe was injured previously been a football event.   Review of Systems     Objective:   Physical Exam GENERAL APPEARANCE: Alert, conversant. Appropriately groomed. No acute distress.  VASCULAR: Pedal pulses are  [facility]  Capillary refill time is immediate to all digits,  Normal temperature gradient.  Digital hair growth is present bilateral  NEUROLOGIC: sensation is normal to 5.07 monofilament at 5/5 sites bilateral.  Light touch is intact bilateral, Muscle strength normal.  MUSCULOSKELETAL: acceptable muscle strength, tone and stability bilateral.  Intrinsic muscluature intact bilateral.  Rectus appearance of foot and digits noted bilateral.  NAIL  Marked incurvation noted along the lateral border of the right great toe.  There is pus abscess and granulation tissue noted along the lateral border of the right great toe. DERMATOLOGIC: skin color, texture, and turgor are within normal limits.  No preulcerative lesions or ulcers  are seen, no interdigital maceration noted.  No open lesions present.  Digital nails are asymptomatic. No drainage noted.      Assessment:     Paronychia right great toe  Ingrown toenail right hallux.    Plan:     IE  Nail surgery  Treatment options and alternatives discussed.  Recommended permanent phenol matrixectomy and patient agreed.  Right  hallux  was prepped with alcohol and a toe block of 5cc of 2% lidocaine plain was administered in a digital toe block. .  The toe was then prepped with betadine solution .  The offending nail border was then excised and matrix tissue exposed.  Phenol was then applied to the matrix tissue followed by an alcohol wash.  Antibiotic ointment and a dry sterile dressing was applied.  The patient was dispensed instructions for aftercare. Patient was instructed to continue on his antibiotics.  Return to clinic in one week for future evaluation and treatment.     Helane GuntherGregory Pearlean Sabina DPM

## 2017-06-12 ENCOUNTER — Encounter: Payer: Self-pay | Admitting: Podiatry

## 2017-06-12 ENCOUNTER — Ambulatory Visit (INDEPENDENT_AMBULATORY_CARE_PROVIDER_SITE_OTHER): Payer: Self-pay | Admitting: Podiatry

## 2017-06-12 DIAGNOSIS — Z09 Encounter for follow-up examination after completed treatment for conditions other than malignant neoplasm: Secondary | ICD-10-CM

## 2017-06-12 NOTE — Progress Notes (Signed)
This patient returns to the office following nail surgery one week ago.  The patient says toe has been soaked and bandaged as directed.  There has been improvement of the toe since the surgery has been performed. The patient presents for continued evaluation and treatment.  GENERAL APPEARANCE: Alert, conversant. Appropriately groomed. No acute distress.  VASCULAR: Pedal pulses palpable at  DP and PT bilateral.  Capillary refill time is immediate to all digits,  Normal temperature gradient.    NEUROLOGIC: sensation is normal to 5.07 monofilament at 5/5 sites bilateral.  Light touch is intact bilateral, Muscle strength normal.  MUSCULOSKELETAL: acceptable muscle strength, tone and stability bilateral.  Intrinsic muscluature intact bilateral.  Rectus appearance of foot and digits noted bilateral.   DERMATOLOGIC: skin color, texture, and turgor are within normal limits.  No preulcerative lesions or ulcers  are seen, no interdigital maceration noted.   NAILS  There is necrotic tissue along the nail groove  In the absence of redness swelling and pain.  DX  S/p nail surgery  ROV  Home instructions were discussed.  Patient to call the office if there are any questions or concerns.   Vernecia Umble DPM   

## 2018-05-06 ENCOUNTER — Other Ambulatory Visit: Payer: Self-pay | Admitting: Nephrology

## 2018-05-06 DIAGNOSIS — N183 Chronic kidney disease, stage 3 unspecified: Secondary | ICD-10-CM

## 2018-05-06 DIAGNOSIS — N179 Acute kidney failure, unspecified: Secondary | ICD-10-CM

## 2018-05-13 ENCOUNTER — Ambulatory Visit
Admission: RE | Admit: 2018-05-13 | Discharge: 2018-05-13 | Disposition: A | Discharge status: Home / Self Care | Payer: BLUE CROSS/BLUE SHIELD | Source: Ambulatory Visit | Attending: Nephrology | Admitting: Nephrology

## 2018-05-13 DIAGNOSIS — N179 Acute kidney failure, unspecified: Secondary | ICD-10-CM

## 2018-05-13 DIAGNOSIS — N183 Chronic kidney disease, stage 3 unspecified: Secondary | ICD-10-CM

## 2018-05-28 ENCOUNTER — Ambulatory Visit: Payer: BLUE CROSS/BLUE SHIELD | Admitting: Podiatry

## 2018-06-01 ENCOUNTER — Ambulatory Visit: Payer: BLUE CROSS/BLUE SHIELD | Admitting: Podiatry

## 2018-06-01 ENCOUNTER — Encounter: Payer: Self-pay | Admitting: Podiatry

## 2018-06-01 DIAGNOSIS — B351 Tinea unguium: Secondary | ICD-10-CM

## 2018-06-01 DIAGNOSIS — E119 Type 2 diabetes mellitus without complications: Secondary | ICD-10-CM | POA: Diagnosis not present

## 2018-06-01 DIAGNOSIS — M79675 Pain in left toe(s): Secondary | ICD-10-CM | POA: Diagnosis not present

## 2018-06-01 NOTE — Progress Notes (Signed)
This patient presents the office with chief complaint of painful left big toenail, great toe.  He says it has grown thick and long and is growing into the corner of his nail.  He says there is no drainage, redness or infection noted.  He says that the nails painful walking and wearing his shoes.  Patient had previous nail surgery performed by myself one year ago on his right big toenail.  He presents the office  today for an evaluation and treatment of this condition.  General Appearance  Alert, conversant and in no acute stress.  Vascular  Dorsalis pedis and posterior tibial  pulses are palpable  bilaterally.  Capillary return is within normal limits  bilaterally. Temperature is within normal limits  bilaterally.  Neurologic  Senn-Weinstein monofilament wire test within normal limits  bilaterally. Muscle power within normal limits bilaterally.  Nails Thick disfigured discolored nails with subungual debris  hallux  left great toe. No evidence of bacterial infection or drainage bilaterally.  Orthopedic  No limitations of motion of motion feet .  No crepitus or effusions noted.  No bony pathology or digital deformities noted.  Skin  normotropic skin with no porokeratosis noted bilaterally.  No signs of infections or ulcers noted.    Onychomycosis left hallux toenail.  ROV  Debridement of nail.  RTC 3 months.   Helane GuntherGregory Kohei Antonellis DPM

## 2020-02-03 ENCOUNTER — Ambulatory Visit: Payer: BLUE CROSS/BLUE SHIELD | Admitting: Podiatry

## 2020-02-07 ENCOUNTER — Ambulatory Visit: Payer: BC Managed Care – PPO | Admitting: Podiatry

## 2020-02-07 ENCOUNTER — Other Ambulatory Visit: Payer: Self-pay

## 2020-02-07 ENCOUNTER — Encounter: Payer: Self-pay | Admitting: Podiatry

## 2020-02-07 DIAGNOSIS — E669 Obesity, unspecified: Secondary | ICD-10-CM | POA: Diagnosis not present

## 2020-02-07 DIAGNOSIS — E1169 Type 2 diabetes mellitus with other specified complication: Secondary | ICD-10-CM

## 2020-02-07 DIAGNOSIS — L03031 Cellulitis of right toe: Secondary | ICD-10-CM | POA: Diagnosis not present

## 2020-02-07 NOTE — Progress Notes (Signed)
This patient presents the office with chief complaint of painful right big toe outside border.  He has pain walking and wearing his shoes.  He says it started to be painful this weekend. He presents the office  today for an evaluation and treatment of this condition.  Patient has history of CKD and DM.  General Appearance  Alert, conversant and in no acute stress.  Vascular  Dorsalis pedis and posterior tibial  pulses are palpable  bilaterally.  Capillary return is within normal limits  bilaterally. Temperature is within normal limits  bilaterally.  Neurologic  Senn-Weinstein monofilament wire test within normal limits  bilaterally. Muscle power within normal limits bilaterally.  Nails Thick disfigured discolored nails with subungual debris  hallux  left great toe.  Nail spicule lateral border right hallux with necrotic tissue at distal nail border right hallux No evidence of bacterial infection or drainage bilaterally.  Orthopedic  No limitations of motion of motion feet .  No crepitus or effusions noted.  No bony pathology or digital deformities noted.  Skin  normotropic skin with no porokeratosis noted bilaterally.  No signs of infections or ulcers noted.    Paronychia right hallux.  ROV  Incision and drainage right hallux toenail lateral border.    RTC 4-6 monhs   Helane Gunther DPM

## 2020-02-15 ENCOUNTER — Inpatient Hospital Stay
Admission: EM | Admit: 2020-02-15 | Discharge: 2020-02-17 | DRG: 177 | Disposition: A | Discharge status: Home / Self Care | Payer: BC Managed Care – PPO | Attending: Internal Medicine | Admitting: Internal Medicine

## 2020-02-15 ENCOUNTER — Encounter: Payer: Self-pay | Admitting: Emergency Medicine

## 2020-02-15 ENCOUNTER — Other Ambulatory Visit: Payer: Self-pay

## 2020-02-15 ENCOUNTER — Emergency Department: Payer: BC Managed Care – PPO

## 2020-02-15 DIAGNOSIS — Z7984 Long term (current) use of oral hypoglycemic drugs: Secondary | ICD-10-CM | POA: Diagnosis not present

## 2020-02-15 DIAGNOSIS — I129 Hypertensive chronic kidney disease with stage 1 through stage 4 chronic kidney disease, or unspecified chronic kidney disease: Secondary | ICD-10-CM | POA: Diagnosis present

## 2020-02-15 DIAGNOSIS — E119 Type 2 diabetes mellitus without complications: Secondary | ICD-10-CM

## 2020-02-15 DIAGNOSIS — I872 Venous insufficiency (chronic) (peripheral): Secondary | ICD-10-CM | POA: Diagnosis present

## 2020-02-15 DIAGNOSIS — Z79899 Other long term (current) drug therapy: Secondary | ICD-10-CM

## 2020-02-15 DIAGNOSIS — J9601 Acute respiratory failure with hypoxia: Secondary | ICD-10-CM | POA: Diagnosis present

## 2020-02-15 DIAGNOSIS — E669 Obesity, unspecified: Secondary | ICD-10-CM | POA: Diagnosis present

## 2020-02-15 DIAGNOSIS — U071 COVID-19: Secondary | ICD-10-CM | POA: Diagnosis present

## 2020-02-15 DIAGNOSIS — Z6841 Body Mass Index (BMI) 40.0 and over, adult: Secondary | ICD-10-CM

## 2020-02-15 DIAGNOSIS — R079 Chest pain, unspecified: Secondary | ICD-10-CM

## 2020-02-15 DIAGNOSIS — I248 Other forms of acute ischemic heart disease: Secondary | ICD-10-CM | POA: Diagnosis present

## 2020-02-15 DIAGNOSIS — J1282 Pneumonia due to coronavirus disease 2019: Secondary | ICD-10-CM | POA: Diagnosis present

## 2020-02-15 DIAGNOSIS — N189 Chronic kidney disease, unspecified: Secondary | ICD-10-CM

## 2020-02-15 DIAGNOSIS — I809 Phlebitis and thrombophlebitis of unspecified site: Secondary | ICD-10-CM | POA: Diagnosis present

## 2020-02-15 DIAGNOSIS — F39 Unspecified mood [affective] disorder: Secondary | ICD-10-CM | POA: Diagnosis present

## 2020-02-15 DIAGNOSIS — D509 Iron deficiency anemia, unspecified: Secondary | ICD-10-CM | POA: Diagnosis present

## 2020-02-15 DIAGNOSIS — N179 Acute kidney failure, unspecified: Secondary | ICD-10-CM | POA: Diagnosis present

## 2020-02-15 DIAGNOSIS — E1122 Type 2 diabetes mellitus with diabetic chronic kidney disease: Secondary | ICD-10-CM | POA: Diagnosis present

## 2020-02-15 DIAGNOSIS — I152 Hypertension secondary to endocrine disorders: Secondary | ICD-10-CM | POA: Diagnosis present

## 2020-02-15 DIAGNOSIS — N183 Chronic kidney disease, stage 3 unspecified: Secondary | ICD-10-CM | POA: Diagnosis present

## 2020-02-15 DIAGNOSIS — E1159 Type 2 diabetes mellitus with other circulatory complications: Secondary | ICD-10-CM | POA: Diagnosis present

## 2020-02-15 DIAGNOSIS — Z7982 Long term (current) use of aspirin: Secondary | ICD-10-CM | POA: Diagnosis not present

## 2020-02-15 DIAGNOSIS — E860 Dehydration: Secondary | ICD-10-CM | POA: Diagnosis present

## 2020-02-15 HISTORY — DX: Chronic kidney disease, stage 3 unspecified: N18.30

## 2020-02-15 HISTORY — DX: Type 2 diabetes mellitus without complications: E11.9

## 2020-02-15 HISTORY — DX: Hypertension secondary to endocrine disorders: I15.2

## 2020-02-15 HISTORY — DX: Type 2 diabetes mellitus with other circulatory complications: E11.59

## 2020-02-15 LAB — CBC
HCT: 35.8 % — ABNORMAL LOW (ref 39.0–52.0)
Hemoglobin: 11.7 g/dL — ABNORMAL LOW (ref 13.0–17.0)
MCH: 23.3 pg — ABNORMAL LOW (ref 26.0–34.0)
MCHC: 32.7 g/dL (ref 30.0–36.0)
MCV: 71.2 fL — ABNORMAL LOW (ref 80.0–100.0)
Platelets: 162 10*3/uL (ref 150–400)
RBC: 5.03 MIL/uL (ref 4.22–5.81)
RDW: 17 % — ABNORMAL HIGH (ref 11.5–15.5)
WBC: 6.5 10*3/uL (ref 4.0–10.5)
nRBC: 0 % (ref 0.0–0.2)

## 2020-02-15 LAB — BASIC METABOLIC PANEL
Anion gap: 10 (ref 5–15)
BUN: 42 mg/dL — ABNORMAL HIGH (ref 6–20)
CO2: 27 mmol/L (ref 22–32)
Calcium: 9.1 mg/dL (ref 8.9–10.3)
Chloride: 104 mmol/L (ref 98–111)
Creatinine, Ser: 3.63 mg/dL — ABNORMAL HIGH (ref 0.61–1.24)
GFR calc Af Amer: 23 mL/min — ABNORMAL LOW (ref >60)
GFR calc non Af Amer: 20 mL/min — ABNORMAL LOW (ref >60)
Glucose, Bld: 127 mg/dL — ABNORMAL HIGH (ref 70–99)
Potassium: 4.2 mmol/L (ref 3.5–5.1)
Sodium: 141 mmol/L (ref 135–145)

## 2020-02-15 LAB — LACTIC ACID, PLASMA: Lactic Acid, Venous: 1.1 mmol/L (ref 0.5–1.9)

## 2020-02-15 LAB — TROPONIN I (HIGH SENSITIVITY)
Troponin I (High Sensitivity): 126 ng/L (ref <18)
Troponin I (High Sensitivity): 122 ng/L (ref <18)

## 2020-02-15 LAB — BRAIN NATRIURETIC PEPTIDE: B Natriuretic Peptide: 18 pg/mL (ref 0.0–100.0)

## 2020-02-15 LAB — POC SARS CORONAVIRUS 2 AG: SARS Coronavirus 2 Ag: POSITIVE — AB

## 2020-02-15 LAB — PROCALCITONIN: Procalcitonin: <0.1 ng/mL

## 2020-02-15 MED ORDER — CLONIDINE HCL 0.3 MG/24HR TD PTWK
0.3000 mg | MEDICATED_PATCH | TRANSDERMAL | Status: DC
  Administered 2020-02-16: 09:00:00 0.3 mg via TRANSDERMAL
  Filled 2020-02-15: qty 1

## 2020-02-15 MED ORDER — ASCORBIC ACID 500 MG PO TABS
500.0000 mg | ORAL_TABLET | Freq: Every day | ORAL | Status: DC
  Administered 2020-02-16 – 2020-02-17 (×2): 500 mg via ORAL
  Filled 2020-02-15 (×2): qty 1

## 2020-02-15 MED ORDER — ONDANSETRON HCL 4 MG PO TABS: 4.0000 mg | ORAL_TABLET | Freq: Four times a day (QID) | ORAL | Status: DC | PRN

## 2020-02-15 MED ORDER — HYDRALAZINE HCL 50 MG PO TABS
100.0000 mg | ORAL_TABLET | Freq: Two times a day (BID) | ORAL | Status: DC
  Administered 2020-02-16 – 2020-02-17 (×4): 100 mg via ORAL
  Filled 2020-02-15 (×4): qty 2

## 2020-02-15 MED ORDER — INSULIN ASPART 100 UNIT/ML ~~LOC~~ SOLN
0.0000 [IU] | Freq: Three times a day (TID) | SUBCUTANEOUS | Status: DC
Start: 2020-02-16 — End: 2020-02-17
  Administered 2020-02-16 (×2): 2 [IU] via SUBCUTANEOUS
  Administered 2020-02-16: 17:00:00 1 [IU] via SUBCUTANEOUS
  Administered 2020-02-17: 2 [IU] via SUBCUTANEOUS
  Filled 2020-02-15 (×4): qty 1

## 2020-02-15 MED ORDER — ASPIRIN 81 MG PO CHEW: 324.0000 mg | CHEWABLE_TABLET | Freq: Once | ORAL | Status: DC

## 2020-02-15 MED ORDER — ZINC SULFATE 220 (50 ZN) MG PO CAPS
220.0000 mg | ORAL_CAPSULE | Freq: Every day | ORAL | Status: DC
  Administered 2020-02-16 – 2020-02-17 (×2): 220 mg via ORAL
  Filled 2020-02-15 (×2): qty 1

## 2020-02-15 MED ORDER — SODIUM CHLORIDE 0.9 % IV BOLUS
1000.0000 mL | Freq: Once | INTRAVENOUS | Status: AC
  Administered 2020-02-15: 22:00:00 1000 mL via INTRAVENOUS

## 2020-02-15 MED ORDER — IPRATROPIUM-ALBUTEROL 20-100 MCG/ACT IN AERS
1.0000 | INHALATION_SPRAY | Freq: Four times a day (QID) | RESPIRATORY_TRACT | Status: DC
  Administered 2020-02-16 – 2020-02-17 (×6): 1 via RESPIRATORY_TRACT
  Filled 2020-02-15 (×2): qty 4

## 2020-02-15 MED ORDER — DEXAMETHASONE SODIUM PHOSPHATE 10 MG/ML IJ SOLN
6.0000 mg | INTRAMUSCULAR | Status: DC
  Administered 2020-02-16 (×2): 6 mg via INTRAVENOUS
  Filled 2020-02-15 (×2): qty 1

## 2020-02-15 MED ORDER — METOPROLOL SUCCINATE ER 50 MG PO TB24
100.0000 mg | ORAL_TABLET | Freq: Every day | ORAL | Status: DC
  Administered 2020-02-16 – 2020-02-17 (×2): 100 mg via ORAL
  Filled 2020-02-15 (×3): qty 2

## 2020-02-15 MED ORDER — SODIUM CHLORIDE 0.9 % IV SOLN: INTRAVENOUS | Status: AC

## 2020-02-15 MED ORDER — ACETAMINOPHEN 325 MG PO TABS: 650.0000 mg | ORAL_TABLET | Freq: Four times a day (QID) | ORAL | Status: DC | PRN

## 2020-02-15 MED ORDER — SODIUM CHLORIDE 0.9 % IV SOLN
200.0000 mg | Freq: Once | INTRAVENOUS | Status: AC
  Administered 2020-02-16: 03:00:00 200 mg via INTRAVENOUS
  Filled 2020-02-15: qty 40

## 2020-02-15 MED ORDER — ONDANSETRON HCL 4 MG/2ML IJ SOLN: 4.0000 mg | Freq: Four times a day (QID) | INTRAMUSCULAR | Status: DC | PRN

## 2020-02-15 MED ORDER — ACETAMINOPHEN 500 MG PO TABS
1000.0000 mg | ORAL_TABLET | Freq: Once | ORAL | Status: AC
  Administered 2020-02-15: 20:00:00 1000 mg via ORAL
  Filled 2020-02-15: qty 2

## 2020-02-15 MED ORDER — AMLODIPINE BESYLATE 5 MG PO TABS
10.0000 mg | ORAL_TABLET | Freq: Every day | ORAL | Status: DC
  Administered 2020-02-16 – 2020-02-17 (×2): 10 mg via ORAL
  Filled 2020-02-15 (×2): qty 2

## 2020-02-15 MED ORDER — LAMOTRIGINE 25 MG PO TABS
50.0000 mg | ORAL_TABLET | Freq: Every day | ORAL | Status: DC
  Administered 2020-02-16 – 2020-02-17 (×2): 50 mg via ORAL
  Filled 2020-02-15 (×2): qty 2

## 2020-02-15 MED ORDER — GUAIFENESIN-DM 100-10 MG/5ML PO SYRP
10.0000 mL | ORAL_SOLUTION | ORAL | Status: DC | PRN
  Filled 2020-02-15: qty 10

## 2020-02-15 MED ORDER — HEPARIN SODIUM (PORCINE) 5000 UNIT/ML IJ SOLN
5000.0000 [IU] | Freq: Three times a day (TID) | INTRAMUSCULAR | Status: DC
  Administered 2020-02-16: 5000 [IU] via SUBCUTANEOUS
  Filled 2020-02-15: qty 1

## 2020-02-15 MED ORDER — HYDROCOD POLST-CPM POLST ER 10-8 MG/5ML PO SUER: 5.0000 mL | Freq: Two times a day (BID) | ORAL | Status: DC | PRN

## 2020-02-15 MED ORDER — TRAZODONE HCL 50 MG PO TABS
50.0000 mg | ORAL_TABLET | Freq: Every day | ORAL | Status: DC
  Administered 2020-02-16: 50 mg via ORAL
  Filled 2020-02-15 (×2): qty 1

## 2020-02-15 MED ORDER — SODIUM CHLORIDE 0.9 % IV SOLN
100.0000 mg | Freq: Every day | INTRAVENOUS | Status: DC
  Administered 2020-02-16 – 2020-02-17 (×2): 100 mg via INTRAVENOUS
  Filled 2020-02-15 (×2): qty 20
  Filled 2020-02-15: qty 100

## 2020-02-15 MED ORDER — PANTOPRAZOLE SODIUM 40 MG PO TBEC
40.0000 mg | DELAYED_RELEASE_TABLET | Freq: Every day | ORAL | Status: DC
  Administered 2020-02-16 – 2020-02-17 (×2): 40 mg via ORAL
  Filled 2020-02-15 (×2): qty 1

## 2020-02-15 NOTE — ED Provider Notes (Signed)
Admission discussed with Dr. Allena Katz of the hospitalist service.   Sharyn Creamer, MD 02/15/20 2127

## 2020-02-15 NOTE — H&P (Signed)
History and Physical    Bobby Montgomery QBV:[id number] DOB: 03/14/1981 DOA: 02/15/2020  PCP: System, Pcp Not In  Patient coming from: Home  I have personally briefly reviewed patient's old medical records in [facility]  Chief Complaint: Fevers, chills, body aches, shortness of breath  HPI: Bobby Montgomery is a 39 y.o. male with medical history significant for type 2 diabetes, hypertension, CKD stage III, chronic venous insufficiency with thrombophlebitis who presents to the ED for evaluation of fevers, chills, body aches, and shortness of breath.  Patient reports about 3 to 4 days of fatigue, low energy, decreased appetite and poor oral intake.  He has been having some body aches and generalized weakness.  He reports chills but denies subjective fevers or diaphoresis.  He has had some lightheadedness without syncope or fall.  He reports nausea without emesis.  He had some shortness of breath earlier today but none currently.  He checked his oxygen status at home and was noted to have low oxygen saturation down to 76% on room air.  He denies any chest pain, cough, abdominal pain, dysuria, or diarrhea.  He presented to the ED for further evaluation.  ED Course:  Initial vitals showed BP 163/89, pulse 87, RR 18, temp 101 Fahrenheit, SPO2 90% on room air.  Labs are notable for BUN 42, creatinine 3.63 (last creatinine 2.37 on 01/25/2020), sodium 141, potassium 4.2, bicarb 27, WBC 6.5, hemoglobin 11.7, platelets 162,000, high-sensitivity troponin I 126, procalcitonin <0.10, lactic acid 1.1.  POC SARS-CoV-2 antigen test is positive.  Blood cultures were obtained and pending.  Patient was placed on 2 L supplemental O2 with improvement in oxygen saturation.  He was given 1 L normal saline and Tylenol.  The hospitalist service was consulted to admit for further evaluation and management.  Review of Systems: All systems reviewed and are negative except as documented in history of present illness  above.   Past Medical History:  Diagnosis Date  . CKD (chronic kidney disease), stage III   . Hypertension associated with diabetes (HCC)   . Type 2 diabetes mellitus (HCC)     History reviewed. No pertinent surgical history.  Social History:  reports that he has never smoked. He has never used smokeless tobacco. He reports that he does not drink alcohol. No history on file for drug.  No Known Allergies  Family History  Adopted: Yes     Prior to Admission medications   Medication Sig Start Date End Date Taking? Authorizing Provider  allopurinol (ZYLOPRIM) 100 MG tablet  04/24/17   [provider]  amLODipine (NORVASC) 10 MG tablet Take by mouth. 10/16/15   [provider]  aspirin 81 MG EC tablet  04/25/17   [provider]  calcitRIOL (ROCALTROL) 0.25 MCG capsule  05/08/17   [provider]  cloNIDine (CATAPRES - DOSED IN MG/24 HR) 0.3 mg/24hr patch Place onto the skin. 04/24/17   [provider]  cloNIDine (CATAPRES - DOSED IN MG/24 HR) 0.3 mg/24hr patch Place onto the skin. 01/25/20 01/24/21  [provider]  Elastic Bandages & Supports (RELIEF KNEE) MISC Apply in the morning and remove at night. 01/06/20   [provider]  fluticasone (FLONASE) 50 MCG/ACT nasal spray Place 2 sprays into both nostrils daily. 12/07/16   Hagler, Jami L, PA-C  furosemide (LASIX) 40 MG tablet Take by mouth. 03/04/19 01/24/21  [provider]  glimepiride (AMARYL) 4 MG tablet Take by mouth. 10/16/15 01/24/21  [provider]  hydrALAZINE (APRESOLINE)  100 MG tablet Take 100 mg by mouth 2 (two) times daily. 01/23/20   [provider]  hydrochlorothiazide (HYDRODIURIL) 25 MG tablet Take by mouth. 07/27/12 01/24/21  [provider]  losartan (COZAAR) 100 MG tablet Take by mouth. 10/16/15 01/24/21  [provider]  metFORMIN (GLUCOPHAGE-XR) 500 MG 24 hr tablet metformin ER 500 mg tablet,extended release 24 hr   TAKE 2 TABLETS (1,000 MG TOTAL) BY MOUTH TWO (2) TIMES A DAY.    [provider]  metoprolol succinate (TOPROL-XL) 100 MG 24 hr tablet Take by mouth. 07/27/12   [provider]  minoxidil (LONITEN) 10 MG tablet Take by mouth. 01/25/20 01/24/21  [provider]  oseltamivir (TAMIFLU) 75 MG capsule Take 1 capsule (75 mg total) by mouth 2 (two) times daily. 12/07/16   Hagler, Jami L, PA-C  pantoprazole (PROTONIX) 40 MG tablet pantoprazole 40 mg tablet,delayed release  TAKE 1 TABLET BY MOUTH EVERY DAY 07/27/12   [provider]  promethazine-dextromethorphan (PROMETHAZINE-DM) 6.25-15 MG/5ML syrup Take 5 mLs by mouth 4 (four) times daily as needed for cough. 12/07/16   Hagler, Jami L, PA-C  traZODone (DESYREL) 50 MG tablet Take by mouth. 01/25/20   [provider]    Physical Exam: Vitals:   02/15/20 1945  BP: (!) 163/89  Pulse: 87  Resp: 18  Temp: (!) 101 F (38.3 C)  TempSrc: Oral  SpO2: 90%   Constitutional: Resting in bed with head elevated, NAD, calm, comfortable Eyes: PERRL, lids and conjunctivae normal ENMT: Mucous membranes are moist. Posterior pharynx clear of any exudate or lesions. Neck: normal, supple, no masses. Respiratory: Bibasilar inspiratory crackles. Normal respiratory effort. No accessory muscle use.  Cardiovascular: Regular rate and rhythm, no murmurs / rubs / gallops. No extremity edema. 2+ pedal pulses. Abdomen: no tenderness, no masses palpated. No hepatosplenomegaly. Bowel sounds positive.  Musculoskeletal: no clubbing / cyanosis. No joint deformity upper and lower extremities. Good ROM, no contractures. Normal muscle tone.  Skin: no rashes, lesions, ulcers. No induration Neurologic: CN 2-12 grossly intact. Sensation intact, Strength 5/5 in all 4.  Psychiatric: Normal judgment and insight. Alert and oriented x 3. Normal mood.   Labs on Admission: I have personally reviewed following labs and imaging studies  CBC: Recent  Labs  Lab 02/15/20 1952  WBC 6.5  HGB 11.7*  HCT 35.8*  MCV 71.2*  PLT 162   Basic Metabolic Panel: Recent Labs  Lab 02/15/20 1952  NA 141  K 4.2  CL 104  CO2 27  GLUCOSE 127*  BUN 42*  CREATININE 3.63*  CALCIUM 9.1   GFR: CrCl cannot be calculated (Unknown ideal weight.). Liver Function Tests: No results for input(s): AST, ALT, ALKPHOS, BILITOT, PROT, ALBUMIN in the last 168 hours. No results for input(s): LIPASE, AMYLASE in the last 168 hours. No results for input(s): AMMONIA in the last 168 hours. Coagulation Profile: No results for input(s): INR, PROTIME in the last 168 hours. Cardiac Enzymes: No results for input(s): CKTOTAL, CKMB, CKMBINDEX, TROPONINI in the last 168 hours. BNP (last 3 results) No results for input(s): PROBNP in the last 8760 hours. HbA1C: No results for input(s): HGBA1C in the last 72 hours. CBG: No results for input(s): GLUCAP in the last 168 hours. Lipid Profile: No results for input(s): CHOL, HDL, LDLCALC, TRIG, CHOLHDL, LDLDIRECT in the last 72 hours. Thyroid Function Tests: No results for input(s): TSH, T4TOTAL, FREET4, T3FREE, THYROIDAB in the last 72 hours. Anemia Panel: No results for input(s): VITAMINB12, FOLATE, FERRITIN,  TIBC, IRON, RETICCTPCT in the last 72 hours. Urine analysis:    Component Value Date/Time   COLORURINE Yellow 07/24/2012 1015   APPEARANCEUR Hazy 07/24/2012 1015   LABSPEC 1.029 07/24/2012 1015   PHURINE 5.0 07/24/2012 1015   GLUCOSEU >=500 07/24/2012 1015   HGBUR 2+ 07/24/2012 1015   BILIRUBINUR Negative 07/24/2012 1015   KETONESUR 1+ 07/24/2012 1015   PROTEINUR >=500 07/24/2012 1015   NITRITE Negative 07/24/2012 1015   LEUKOCYTESUR Negative 07/24/2012 1015    Radiological Exams on Admission: DG Chest Port 1 View  Result Date: 02/15/2020 CLINICAL DATA:  Chest pain. EXAM: PORTABLE CHEST 1 VIEW COMPARISON:  November 06, 2009 FINDINGS: Very mild, hazy areas of atelectasis and/or early infiltrate are seen  within the bilateral lung bases and mid left lung. There is no evidence of a pleural effusion or pneumothorax. The cardiac silhouette is mildly enlarged. The visualized skeletal structures are unremarkable. IMPRESSION: Very mild, hazy bibasilar and mid left lung atelectasis and/or early infiltrate. Electronically Signed   By: Virgina Norfolk M.D.   On: 02/15/2020 20:10    EKG: Independently reviewed. Sinus rhythm, nonspecific ST elevation in V2.  When compared to prior EKG in 2010 reciprocal ST changes are noted on V2-V3.  Assessment/Plan Principal Problem:   Acute hypoxemic respiratory failure due to COVID-19 [facility]) Active Problems:   Hypertension associated with diabetes ([place])   Type 2 diabetes mellitus ([place])   Acute kidney injury superimposed on CKD (HCC)  Bobby Montgomery is a 39 y.o. male with medical history significant for type 2 diabetes, hypertension, CKD stage III, chronic venous insufficiency with thrombophlebitis who is admitted for acute hypoxemic respiratory failure due to COVID-19 pneumonitis.  Acute hypoxemic respiratory failure due to COVID-19 pneumonitis: SARS-CoV-2 antigen positive 02/15/2020.  O2 saturation 90% on room air on arrival to the ED.  Chest x-ray shows bibasilar infiltrates. -Start IV remdesivir per pharmacy protocol -Start IV Decadron 6 mg daily -Continue supplemental oxygen as needed and wean off as able -Continue Combivent, incentive spirometer, flutter valve -Antitussives, vitamin C, zinc -Monitor daily inflammatory markers  AKI on CKD stage III: Creatinine up to 3.63 compared to 2.37 on 01/25/2020.  Likely combination of dehydration and medication effect. -1 L normal saline being given in the ED, follow with maintenance IV fluids overnight -Hold home losartan, HCTZ, Metformin, minoxidil, Lasix -Avoid NSAIDs  Elevated troponin: High-sensitivity troponin I 126.  EKG with nonspecific ST changes in V2.  Patient denies any chest pain.  Likely demand ischemia  in setting of acute hypoxic respiratory failure and AKI on CKD stage III.  Cycle cardiac enzymes.  Hypertension: Patient on numerous antihypertensives as an outpatient. -Holding home losartan, HCTZ, minoxidil, Lasix in setting of AKI -Resume amlodipine, hydralazine, Toprol-XL -Continue clonidine patch, hold oral clonidine  Type 2 diabetes: Hemoglobin A1c 6.3 on 01/25/2020.  Holding home Metformin and Amaryl while in hospital.  Continue sensitive SSI and adjust as needed while on steroids.  Chronic venous insufficiency and thrombophlebitis: Chronic and stable, no lower extremity edema at time of admission.  Can place compression stockings if needed.  Mood disorder: Patient reports recently been started on Lamictal and trazodone for unspecified mood disorder.  Will continue both.  DVT prophylaxis: Subcutaneous heparin Code Status: Full code, confirmed with patient Family Communication: Discussed with wife at bedside Disposition Plan: From home, likely discharge to home pending improvement in symptoms, oxygenation status, and need for completion of IV remdesivir course in hospital versus outpatient. Consults called: None Admission status: Inpatient for management  of acute hypoxemic respiratory failure due to COVID-19 pneumonitis.   Bobby Mclean MD [facility]  If 7PM-7AM, please contact night-coverage [email or web address]  02/15/2020, 10:06 PM

## 2020-02-15 NOTE — ED Provider Notes (Signed)
[facility] Emergency Department Provider Note   ____________________________________________   First MD Initiated Contact with Patient 02/15/20 1958     (approximate)  I have reviewed the triage vital signs and the nursing notes.   HISTORY  Chief Complaint Fever, fatigue   HPI Bobby Montgomery is a 39 y.o. male history diabetes hypertension  On roughly Sunday patient started having fevers and chills, body aches especially across his back.  Said some slight shortness of breath today, his oxygen level went down when they measured at home as low as 76% on room air  He reports right now he does not really feel too short of breath just a little bit.  No chest pain.  No nausea or vomiting.  Fatigue however some body aches across his back.  Fevers.  He has been very tired sleeping a lot the last 3 to 5 days  No known Covid exposure.   Past Medical History:  Diagnosis Date  . Diabetes mellitus without complication (HCC)   . Hypertension     Patient Active Problem List   Diagnosis Date Noted  . Paronychia of great toe, right 02/07/2020  . Hypertension 06/02/2017  . Mechanical back pain 06/02/2017  . Morbid obesity (HCC) 06/02/2017  . Venous stasis 06/02/2017  . Diabetes mellitus type 2 in obese (HCC) 04/24/2017  . Essential hypertension 04/24/2017  . Gastroesophageal reflux disease without esophagitis 04/24/2017  . Stage 3 chronic kidney disease 04/24/2017  . Long-term insulin use (HCC) 09/16/2014  . Type II diabetes mellitus with renal manifestations, uncontrolled (HCC) 09/16/2014    History reviewed. No pertinent surgical history.  Prior to Admission medications   Medication Sig Start Date End Date Taking? Authorizing Provider  allopurinol (ZYLOPRIM) 100 MG tablet  04/24/17   [provider]  amLODipine (NORVASC) 10 MG tablet Take by mouth. 10/16/15   [provider]  aspirin 81 MG EC tablet  04/25/17   [provider]    calcitRIOL (ROCALTROL) 0.25 MCG capsule  05/08/17   [provider]  cloNIDine (CATAPRES - DOSED IN MG/24 HR) 0.3 mg/24hr patch Place onto the skin. 04/24/17   [provider]  cloNIDine (CATAPRES - DOSED IN MG/24 HR) 0.3 mg/24hr patch Place onto the skin. 01/25/20 01/24/21  [provider]  Elastic Bandages & Supports (RELIEF KNEE) MISC Apply in the morning and remove at night. 01/06/20   [provider]  fluticasone (FLONASE) 50 MCG/ACT nasal spray Place 2 sprays into both nostrils daily. 12/07/16   Hagler, Jami L, PA-C  furosemide (LASIX) 40 MG tablet Take by mouth. 03/04/19 01/24/21  [provider]  glimepiride (AMARYL) 4 MG tablet Take by mouth. 10/16/15 01/24/21  [provider]  hydrALAZINE (APRESOLINE) 100 MG tablet Take 100 mg by mouth 2 (two) times daily. 01/23/20   [provider]  hydrochlorothiazide (HYDRODIURIL) 25 MG tablet Take by mouth. 07/27/12 01/24/21  [provider]  losartan (COZAAR) 100 MG tablet Take by mouth. 10/16/15 01/24/21  [provider]  metFORMIN (GLUCOPHAGE-XR) 500 MG 24 hr tablet metformin ER 500 mg tablet,extended release 24 hr  TAKE 2 TABLETS (1,000 MG TOTAL) BY MOUTH TWO (2) TIMES A DAY.    [provider]  metoprolol succinate (TOPROL-XL) 100 MG 24 hr tablet Take by mouth. 07/27/12   [provider]  minoxidil (LONITEN) 10 MG tablet Take by mouth. 01/25/20 01/24/21  [provider]  oseltamivir (TAMIFLU) 75 MG capsule Take 1 capsule (75 mg total) by mouth  2 (two) times daily. 12/07/16   Hagler, Jami L, PA-C  pantoprazole (PROTONIX) 40 MG tablet pantoprazole 40 mg tablet,delayed release  TAKE 1 TABLET BY MOUTH EVERY DAY 07/27/12   [provider]  promethazine-dextromethorphan (PROMETHAZINE-DM) 6.25-15 MG/5ML syrup Take 5 mLs by mouth 4 (four) times daily as needed for cough. 12/07/16   Hagler, Jami L, PA-C  traZODone (DESYREL) 50 MG tablet Take by mouth. 01/25/20    [provider]    Allergies Patient has no known allergies.  History reviewed. No pertinent family history.  Social History Social History   Tobacco Use  . Smoking status: Never Smoker  . Smokeless tobacco: Never Used  Substance Use Topics  . Alcohol use: No  . Drug use: Not on file    Review of Systems Constitutional: See HPI eyes: No visual changes. ENT: No sore throat. Cardiovascular: Denies chest pain. Respiratory: See HPI gastrointestinal: No abdominal pain.   Genitourinary: Negative for dysuria. Musculoskeletal: Back muscle seem achy Skin: Negative for rash. Neurological: Negative for headaches, areas of focal weakness or numbness.    ____________________________________________   PHYSICAL EXAM:  VITAL SIGNS: ED Triage Vitals [02/15/20 1945]  Enc Vitals Group     BP (!) 163/89     Pulse Rate 87     Resp 18     Temp (!) 101 F (38.3 C)     Temp Source Oral     SpO2 90 %     Weight      Height      Head Circumference      Peak Flow      Pain Score      Pain Loc      Pain Edu?      Excl. in GC?     Constitutional: Alert and oriented.  Mildly ill-appearing, no acute distress though. Eyes: Conjunctivae are normal. Head: Atraumatic. Nose: No congestion/rhinnorhea. Mouth/Throat: Mucous membranes are moist. Neck: No stridor.  Cardiovascular: Minimally tachycardic rate, regular rhythm. Grossly normal heart sounds.  Good peripheral circulation. Respiratory: Normal respiratory effort.  No retractions. Lungs CTAB. Gastrointestinal: Soft and nontender. No distention. Musculoskeletal: No lower extremity tenderness nor edema. Neurologic:  Normal speech and language. No gross focal neurologic deficits are appreciated.  Skin:  Skin is warm, dry and intact. No rash noted. Psychiatric: Mood and affect are normal. Speech and behavior are normal.  ____________________________________________   LABS (all labs ordered are listed, but only abnormal  results are displayed)  Labs Reviewed  BASIC METABOLIC PANEL - Abnormal; Notable for the following components:      Result Value   Glucose, Bld 127 (*)    BUN 42 (*)    Creatinine, Ser 3.63 (*)    GFR calc non Af Amer 20 (*)    GFR calc Af Amer 23 (*)    All other components within normal limits  CBC - Abnormal; Notable for the following components:   Hemoglobin 11.7 (*)    HCT 35.8 (*)    MCV 71.2 (*)    MCH 23.3 (*)    RDW 17.0 (*)    All other components within normal limits  POC SARS CORONAVIRUS 2 AG - Abnormal; Notable for the following components:   SARS Coronavirus 2 Ag POSITIVE (*)    All other components within normal limits  TROPONIN I (HIGH SENSITIVITY) - Abnormal; Notable for the following components:   Troponin I (High Sensitivity) 126 (*)    All other components within normal limits  SARS  CORONAVIRUS 2 (TAT 6-24 HRS)  CULTURE, BLOOD (ROUTINE X 2)  CULTURE, BLOOD (ROUTINE X 2)  LACTIC ACID, PLASMA  PROCALCITONIN  TROPONIN I (HIGH SENSITIVITY)   ____________________________________________  EKG  Reviewed interpreted by me at [place] rate 90 QRS 100 QTc 430 Normal sinus rhythm, left axis deviation.  Mild ST elevation in V2 only, however an unusual saddleback type appearance.  Does not appear consistent with a STEMI as only ST abnormality (elevated) in V2 only ____________________________________________  RADIOLOGY  DG Chest Port 1 View  Result Date: 02/15/2020 CLINICAL DATA:  Chest pain. EXAM: PORTABLE CHEST 1 VIEW COMPARISON:  November 06, 2009 FINDINGS: Very mild, hazy areas of atelectasis and/or early infiltrate are seen within the bilateral lung bases and mid left lung. There is no evidence of a pleural effusion or pneumothorax. The cardiac silhouette is mildly enlarged. The visualized skeletal structures are unremarkable. IMPRESSION: Very mild, hazy bibasilar and mid left lung atelectasis and/or early infiltrate. Electronically Signed   By: Virgina Norfolk M.D.   On: 02/15/2020 20:10    Imaging reviewed, hazy bibasilar atelectasis or infiltrates ____________________________________________   PROCEDURES  Procedure(s) performed: None  Procedures  Critical Care performed: Yes, see critical care note(s)  CRITICAL CARE Performed by: Delman Kitten   Total critical care time: 25 minutes  Critical care time was exclusive of separately billable procedures and treating other patients.  Critical care was necessary to treat or prevent imminent or life-threatening deterioration.  Critical care was time spent personally by me on the following activities: development of treatment plan with patient and/or surrogate as well as nursing, discussions with consultants, evaluation of patient's response to treatment, examination of patient, obtaining history from patient or surrogate, ordering and performing treatments and interventions, ordering and review of laboratory studies, ordering and review of radiographic studies, pulse oximetry and re-evaluation of patient's condition.  Patient presents with mild hypoxia, bilateral infiltrates and positive COVID-19 diagnosis with elevated troponin which appears to be in the setting of likely demand ischemia given his lack of chest pain.  ____________________________________________   INITIAL IMPRESSION / ASSESSMENT AND PLAN / ED COURSE  Pertinent labs & imaging results that were available during my care of the patient were reviewed by me and considered in my medical decision making (see chart for details).   Patient notes with fever, some shortness of breath body aches.  Chest x-ray concerning for some possible bilateral infiltrates given his clinical history I am concerned about the possibility of viral etiology such as Covid or pneumonia  Upon review of his Covid test it is returned positive this seems to fit his clinical picture well.  He also has acute kidney injury, mildly elevated troponin likely  secondary to this.  He does have ST abnormality notable only in V2 but does not appear consistent with STEMI is also not have any chest pain.  ----------------------------------------- 9:17 PM on 02/15/2020 -----------------------------------------    Discussed with the patient and his family updated them, and they are understanding of plan for admission to the hospital.  Admission request placed to hospitalist      ____________________________________________   FINAL CLINICAL IMPRESSION(S) / ED DIAGNOSES  Final diagnoses:  COVID-19  AKI (acute kidney injury) ([place])        Note:  This document was prepared using Dragon voice recognition software and may include unintentional dictation errors       Delman Kitten, MD 02/15/20 2117

## 2020-02-15 NOTE — Progress Notes (Signed)
Remdesivir - Pharmacy Brief Note   O:  CXR: IMPRESSION: Very mild, hazy bibasilar and mid left lung atelectasis and/or early infiltrate. SpO2: 90% on RA   A/P:  Remdesivir 200 mg IVPB once followed by 100 mg IVPB daily x 4 days.   Thomasene Ripple, PharmD, BCPS Clinical Pharmacist 02/15/2020 10:17 PM

## 2020-02-15 NOTE — ED Notes (Signed)
Date and time results received: 02/15/20 2030  Test: Troponin I Critical Value: 126  Name of Provider Notified: Dr. Fanny Bien  Orders Received? Or Actions Taken?: no new orders at this time

## 2020-02-16 DIAGNOSIS — U071 COVID-19: Principal | ICD-10-CM

## 2020-02-16 DIAGNOSIS — J9601 Acute respiratory failure with hypoxia: Secondary | ICD-10-CM

## 2020-02-16 LAB — HIV ANTIBODY (ROUTINE TESTING W REFLEX): HIV Screen 4th Generation wRfx: NONREACTIVE

## 2020-02-16 LAB — COMPREHENSIVE METABOLIC PANEL
ALT: 17 U/L (ref 0–44)
AST: 14 U/L — ABNORMAL LOW (ref 15–41)
Albumin: 3.9 g/dL (ref 3.5–5.0)
Alkaline Phosphatase: 54 U/L (ref 38–126)
Anion gap: 7 (ref 5–15)
BUN: 41 mg/dL — ABNORMAL HIGH (ref 6–20)
CO2: 26 mmol/L (ref 22–32)
Calcium: 8.4 mg/dL — ABNORMAL LOW (ref 8.9–10.3)
Chloride: 105 mmol/L (ref 98–111)
Creatinine, Ser: 3.71 mg/dL — ABNORMAL HIGH (ref 0.61–1.24)
GFR calc Af Amer: 23 mL/min — ABNORMAL LOW (ref >60)
GFR calc non Af Amer: 19 mL/min — ABNORMAL LOW (ref >60)
Glucose, Bld: 142 mg/dL — ABNORMAL HIGH (ref 70–99)
Potassium: 3.9 mmol/L (ref 3.5–5.1)
Sodium: 138 mmol/L (ref 135–145)
Total Bilirubin: 0.6 mg/dL (ref 0.3–1.2)
Total Protein: 7.7 g/dL (ref 6.5–8.1)

## 2020-02-16 LAB — CBC WITH DIFFERENTIAL/PLATELET
Abs Immature Granulocytes: 0.02 10*3/uL (ref 0.00–0.07)
Basophils Absolute: 0 10*3/uL (ref 0.0–0.1)
Basophils Relative: 0 %
Eosinophils Absolute: 0 10*3/uL (ref 0.0–0.5)
Eosinophils Relative: 0 %
HCT: 32.3 % — ABNORMAL LOW (ref 39.0–52.0)
Hemoglobin: 10.5 g/dL — ABNORMAL LOW (ref 13.0–17.0)
Immature Granulocytes: 0 %
Lymphocytes Relative: 23 %
Lymphs Abs: 1.2 10*3/uL (ref 0.7–4.0)
MCH: 23.2 pg — ABNORMAL LOW (ref 26.0–34.0)
MCHC: 32.5 g/dL (ref 30.0–36.0)
MCV: 71.5 fL — ABNORMAL LOW (ref 80.0–100.0)
Monocytes Absolute: 0.5 10*3/uL (ref 0.1–1.0)
Monocytes Relative: 9 %
Neutro Abs: 3.4 10*3/uL (ref 1.7–7.7)
Neutrophils Relative %: 68 %
Platelets: 152 10*3/uL (ref 150–400)
RBC: 4.52 MIL/uL (ref 4.22–5.81)
RDW: 16.9 % — ABNORMAL HIGH (ref 11.5–15.5)
WBC: 5.1 10*3/uL (ref 4.0–10.5)
nRBC: 0 % (ref 0.0–0.2)

## 2020-02-16 LAB — TROPONIN I (HIGH SENSITIVITY)
Troponin I (High Sensitivity): 109 ng/L (ref <18)
Troponin I (High Sensitivity): 93 ng/L — ABNORMAL HIGH (ref <18)

## 2020-02-16 LAB — FERRITIN: Ferritin: 124 ng/mL (ref 24–336)

## 2020-02-16 LAB — FIBRIN DERIVATIVES D-DIMER (ARMC ONLY): Fibrin derivatives D-dimer (ARMC): 596.87 ng/mL (FEU) — ABNORMAL HIGH (ref 0.00–499.00)

## 2020-02-16 LAB — C-REACTIVE PROTEIN: CRP: 1.3 mg/dL — ABNORMAL HIGH (ref <1.0)

## 2020-02-16 LAB — ABO/RH: ABO/RH(D): B POS

## 2020-02-16 LAB — GLUCOSE, CAPILLARY
Glucose-Capillary: 157 mg/dL — ABNORMAL HIGH (ref 70–99)
Glucose-Capillary: 196 mg/dL — ABNORMAL HIGH (ref 70–99)
Glucose-Capillary: 122 mg/dL — ABNORMAL HIGH (ref 70–99)
Glucose-Capillary: 123 mg/dL — ABNORMAL HIGH (ref 70–99)

## 2020-02-16 LAB — PHOSPHORUS: Phosphorus: 4.8 mg/dL — ABNORMAL HIGH (ref 2.5–4.6)

## 2020-02-16 LAB — MAGNESIUM: Magnesium: 2.1 mg/dL (ref 1.7–2.4)

## 2020-02-16 MED ORDER — PNEUMOCOCCAL VAC POLYVALENT 25 MCG/0.5ML IJ INJ: 0.5000 mL | INJECTION | INTRAMUSCULAR | Status: DC

## 2020-02-16 MED ORDER — HEPARIN SODIUM (PORCINE) 5000 UNIT/ML IJ SOLN
7500.0000 [IU] | Freq: Three times a day (TID) | INTRAMUSCULAR | Status: DC
  Administered 2020-02-16 – 2020-02-17 (×3): 7500 [IU] via SUBCUTANEOUS
  Filled 2020-02-16 (×3): qty 2

## 2020-02-16 MED ORDER — SODIUM CHLORIDE 0.9 % IV SOLN: INTRAVENOUS | Status: DC

## 2020-02-16 NOTE — Progress Notes (Signed)
Alerted Dr. Luberta Robertson that patient's BP was 143/72 and patient has a lot of BP meds. Requested MD review and ensure patient should receive all of them. MD stated to give all due medications. Will continue to monitor.

## 2020-02-16 NOTE — Progress Notes (Signed)
PROGRESS NOTE    Bobby Montgomery  LFY:[id number]  DOB: 1980-11-14  DOA: 02/15/2020 PCP: System, Pcp Not In Outpatient Specialists:   Hospital course:  39 year old man with HTN, DM 2, CKD 3, venous insufficiency, obesity was admitted for 621 with COVID-19 pneumonia and asthenia.  He did not have any vomiting or diarrhea.  Initial CRP was 1.7.  O2 sats at home but reportedly 76% but here in ED O2 sats were 90% on room air.  Chest x-ray with bibasilar infiltrates.   Subjective:  Patient states he feels much better today than he did yesterday.  Less fatigue.  Much less shortness of breath.  He still quite tired and is short of breath going to the bathroom but feels much improved from yesterday.  Objective: Vitals:   02/16/20 0640 02/16/20 0809 02/16/20 1205 02/16/20 1301  BP: (!) 152/80 (!) 143/72 135/64   Pulse: 79 79  71  Resp: 18 20 (!) 21 20  Temp:  98.3 F (36.8 C)    TempSrc:  Oral    SpO2: 93% 95%  93%  Weight:      Height:        Intake/Output Summary (Last 24 hours) at 02/16/2020 1302 Last data filed at 02/16/2020 0400 Gross per 24 hour  Intake 1654.62 ml  Output --  Net 1654.62 ml   Filed Weights   02/15/20 2200 02/15/20 2316  Weight: (!) 174.5 kg (!) 163.3 kg     Assessment & Plan:  39 y.o. male with HTN, DM 2, CKD 3 and insufficiency with thrombophlebitis is admitted for acute hypoxemic respiratory failure due to COVID-19 pneumonitis, found to have acute kidney injury.  Acute hypoxemic respiratory failure due to COVID-19 pneumonitis: SARS-CoV-2 antigen positive 02/15/2020.   O2 saturation 90% on room air on arrival to the ED.   Chest x-ray shows bibasilar infiltrates. Initial CRP 1.7, pro calcitonin less than 0.10 Patient's oxygen has been weaned down to 2 L today, patient feels much better Remdesivir 2/5 Decadron 2/10 Continue as needed inhaled bronchodilators   AKI on CKD stage III: Creatinine up to 3.63 compared to 2.37 on 01/25/2020.   Likely  combination of dehydration and medication effect. Not improved overnight, repeat today is 3.7 Will start patient on normal saline at 100 cc an hour for 24 hours and then stop. Agree with holding losartan, HCTZ, metformin, minoxidil and Lasix. Avoid renal toxic meds.  Elevated troponin: High-sensitivity troponin I 126, EKG without concerning changes. Possibly demand ischemia in setting of acute hypoxic respiratory failure and AKI on CKD stage III.   Cycle cardiac enzymes. Patient denies chest pain.  Hypertension: Blood pressure well controlled on amlodipine, hydralazine, Toprol-XL and clonidine patch Losartan, HCTZ, minoxidil, Lasix and oral clonidine are being held.  Type 2 diabetes: SSI AC at bedtime while on steroids Hold Metformin and glimepiride  Anemia Microcytic anemia with high RDW, possibly secondary to CKD 3 Will follow, doubt blood loss anemia  Chronic venous insufficiency and thrombophlebitis: Chronic and stable, no lower extremity edema at time of admission.   Can place compression stockings if needed.  Mood disorder: Continue Lamictal and trazodone   DVT prophylaxis: Lovenox Code Status: Full Family Communication: Patient states he is speaking with his wife, no need to call Disposition Plan:   Patient is from: Home  Anticipated Discharge Location: Home  Barriers to Discharge: Acute Covid pneumonia  Is patient medically stable for Discharge: No   Consultants:  None  Procedures:  None  Antimicrobials:  None  Exam:  General: Large, obese man lying in bed in no acute distress.  He seems somewhat anxious but is in no respiratory difficulty.  No accessory muscle use. Eyes: sclera anicteric, conjuctiva mild injection bilaterally CVS: S1-S2, regular  Respiratory:  decreased air entry bilaterally secondary difficulty with auscultation given very large body habitus, he does have some rales at bases  GI: NABS, soft, NT  LE: No edema.  Neuro: A/O  x 3, Moving all extremities equally with normal strength, CN 3-12 intact, grossly nonfocal.  Psych: patient is logical and coherent, judgement and insight appear normal, mood and affect appropriate to situation.   Data Reviewed: Basic Metabolic Panel: Recent Labs  Lab 02/15/20 1952 02/16/20 0007  NA 141 138  K 4.2 3.9  CL 104 105  CO2 27 26  GLUCOSE 127* 142*  BUN 42* 41*  CREATININE 3.63* 3.71*  CALCIUM 9.1 8.4*  MG  --  2.1  PHOS  --  4.8*   Liver Function Tests: Recent Labs  Lab 02/16/20 0007  AST 14*  ALT 17  ALKPHOS 54  BILITOT 0.6  PROT 7.7  ALBUMIN 3.9   No results for input(s): LIPASE, AMYLASE in the last 168 hours. No results for input(s): AMMONIA in the last 168 hours. CBC: Recent Labs  Lab 02/15/20 1952 02/16/20 0007  WBC 6.5 5.1  NEUTROABS  --  3.4  HGB 11.7* 10.5*  HCT 35.8* 32.3*  MCV 71.2* 71.5*  PLT 162 152   Cardiac Enzymes: No results for input(s): CKTOTAL, CKMB, CKMBINDEX, TROPONINI in the last 168 hours. BNP (last 3 results) No results for input(s): PROBNP in the last 8760 hours. CBG: Recent Labs  Lab 02/16/20 0809 02/16/20 1146  GLUCAP 157* 196*    Recent Results (from the past 240 hour(s))  Blood Culture (routine x 2)     Status: None (Preliminary result)   Collection Time: 02/15/20  8:29 PM   Specimen: BLOOD  Result Value Ref Range Status   Specimen Description BLOOD LEFT ANTECUBITAL  Final   Special Requests   Final    BOTTLES DRAWN AEROBIC AND ANAEROBIC Blood Culture adequate volume   Culture   Final    NO GROWTH < 12 HOURS Performed at [facility], [address], [place], [place] [address]    Report Status PENDING  Incomplete  Blood Culture (routine x 2)     Status: None (Preliminary result)   Collection Time: 02/15/20  8:29 PM   Specimen: BLOOD  Result Value Ref Range Status   Specimen Description BLOOD RIGHT ANTECUBITAL  Final   Special Requests   Final    BOTTLES DRAWN AEROBIC AND ANAEROBIC Blood  Culture adequate volume   Culture   Final    NO GROWTH < 12 HOURS Performed at [facility], [address] [place], [place] [address]    Report Status PENDING  Incomplete      Studies: DG Chest Port 1 View  Result Date: 02/15/2020 CLINICAL DATA:  Chest pain. EXAM: PORTABLE CHEST 1 VIEW COMPARISON:  November 06, 2009 FINDINGS: Very mild, hazy areas of atelectasis and/or early infiltrate are seen within the bilateral lung bases and mid left lung. There is no evidence of a pleural effusion or pneumothorax. The cardiac silhouette is mildly enlarged. The visualized skeletal structures are unremarkable. IMPRESSION: Very mild, hazy bibasilar and mid left lung atelectasis and/or early infiltrate. Electronically Signed   By: Aram Candela M.D.   On: 02/15/2020 20:10  Scheduled Meds: . amLODipine  10 mg Oral Daily  . vitamin C  500 mg Oral Daily  . cloNIDine  0.3 mg Transdermal Weekly  . dexamethasone (DECADRON) injection  6 mg Intravenous Q24H  . heparin  7,500 Units Subcutaneous Q8H  . hydrALAZINE  100 mg Oral BID  . insulin aspart  0-9 Units Subcutaneous TID WC  . Ipratropium-Albuterol  1 puff Inhalation Q6H  . lamoTRIgine  50 mg Oral Daily  . metoprolol succinate  100 mg Oral Daily  . pantoprazole  40 mg Oral Daily  . pneumococcal 23 valent vaccine  0.5 mL Intramuscular Tomorrow-1000  . traZODone  50 mg Oral QHS  . zinc sulfate  220 mg Oral Daily   Continuous Infusions: . remdesivir 100 mg in NS 100 mL 100 mg (02/16/20 0916)    Principal Problem:   Acute hypoxemic respiratory failure due to COVID-19 [facility]) Active Problems:   Hypertension associated with diabetes (HCC)   Type 2 diabetes mellitus (HCC)   Acute kidney injury superimposed on CKD (HCC)     Wael Maestas Orma Flaming, [facility]  If 7PM-7AM, please contact night-coverage [email or web address] Password [email or web address] 02/16/2020, 1:02 PM    LOS: 1 day

## 2020-02-17 LAB — COMPREHENSIVE METABOLIC PANEL
ALT: 24 U/L (ref 0–44)
AST: 18 U/L (ref 15–41)
Albumin: 4.1 g/dL (ref 3.5–5.0)
Alkaline Phosphatase: 57 U/L (ref 38–126)
Anion gap: 6 (ref 5–15)
BUN: 37 mg/dL — ABNORMAL HIGH (ref 6–20)
CO2: 25 mmol/L (ref 22–32)
Calcium: 8.8 mg/dL — ABNORMAL LOW (ref 8.9–10.3)
Chloride: 108 mmol/L (ref 98–111)
Creatinine, Ser: 2.26 mg/dL — ABNORMAL HIGH (ref 0.61–1.24)
GFR calc Af Amer: 41 mL/min — ABNORMAL LOW (ref >60)
GFR calc non Af Amer: 35 mL/min — ABNORMAL LOW (ref >60)
Glucose, Bld: 180 mg/dL — ABNORMAL HIGH (ref 70–99)
Potassium: 4.8 mmol/L (ref 3.5–5.1)
Sodium: 139 mmol/L (ref 135–145)
Total Bilirubin: 0.6 mg/dL (ref 0.3–1.2)
Total Protein: 7.8 g/dL (ref 6.5–8.1)

## 2020-02-17 LAB — CBC WITH DIFFERENTIAL/PLATELET
Abs Immature Granulocytes: 0.03 10*3/uL (ref 0.00–0.07)
Basophils Absolute: 0 10*3/uL (ref 0.0–0.1)
Basophils Relative: 0 %
Eosinophils Absolute: 0 10*3/uL (ref 0.0–0.5)
Eosinophils Relative: 0 %
HCT: 32.9 % — ABNORMAL LOW (ref 39.0–52.0)
Hemoglobin: 10.9 g/dL — ABNORMAL LOW (ref 13.0–17.0)
Immature Granulocytes: 1 %
Lymphocytes Relative: 13 %
Lymphs Abs: 0.7 10*3/uL (ref 0.7–4.0)
MCH: 23.3 pg — ABNORMAL LOW (ref 26.0–34.0)
MCHC: 33.1 g/dL (ref 30.0–36.0)
MCV: 70.3 fL — ABNORMAL LOW (ref 80.0–100.0)
Monocytes Absolute: 0.2 10*3/uL (ref 0.1–1.0)
Monocytes Relative: 4 %
Neutro Abs: 4.2 10*3/uL (ref 1.7–7.7)
Neutrophils Relative %: 82 %
Platelets: 169 10*3/uL (ref 150–400)
RBC: 4.68 MIL/uL (ref 4.22–5.81)
RDW: 16.4 % — ABNORMAL HIGH (ref 11.5–15.5)
WBC: 5.2 10*3/uL (ref 4.0–10.5)
nRBC: 0 % (ref 0.0–0.2)

## 2020-02-17 LAB — MAGNESIUM: Magnesium: 2.1 mg/dL (ref 1.7–2.4)

## 2020-02-17 LAB — GLUCOSE, CAPILLARY
Glucose-Capillary: 152 mg/dL — ABNORMAL HIGH (ref 70–99)
Glucose-Capillary: 141 mg/dL — ABNORMAL HIGH (ref 70–99)

## 2020-02-17 LAB — TROPONIN I (HIGH SENSITIVITY)
Troponin I (High Sensitivity): 90 ng/L — ABNORMAL HIGH (ref <18)
Troponin I (High Sensitivity): 84 ng/L — ABNORMAL HIGH (ref <18)

## 2020-02-17 LAB — PHOSPHORUS: Phosphorus: 2.7 mg/dL (ref 2.5–4.6)

## 2020-02-17 LAB — C-REACTIVE PROTEIN: CRP: 1.1 mg/dL — ABNORMAL HIGH (ref <1.0)

## 2020-02-17 LAB — FERRITIN: Ferritin: 141 ng/mL (ref 24–336)

## 2020-02-17 LAB — FIBRIN DERIVATIVES D-DIMER (ARMC ONLY): Fibrin derivatives D-dimer (ARMC): 564.64 ng/mL (FEU) — ABNORMAL HIGH (ref 0.00–499.00)

## 2020-02-17 MED ORDER — IPRATROPIUM-ALBUTEROL 20-100 MCG/ACT IN AERS: 1.0000 | INHALATION_SPRAY | Freq: Four times a day (QID) | RESPIRATORY_TRACT | 0 refills | Status: AC | PRN

## 2020-02-17 MED ORDER — PREDNISONE 10 MG PO TABS: 40.0000 mg | ORAL_TABLET | Freq: Every day | ORAL | 0 refills | Status: AC

## 2020-02-17 NOTE — Progress Notes (Signed)
Spoke with wife on speaker phone with pt. Present and reviewed discharge instructions. Addressed all needs. No further concerns expressed.

## 2020-02-17 NOTE — Discharge Instructions (Addendum)
You are scheduled for an outpatient infusion of Remdesivir at 10:00 AM on Friday 4/9, Saturday 4/10 and Sunday 4/11.Bobby Montgomery  Please report to [facility] at [address]  Drive to the security guard and tell them you are here for an infusion. They will direct you to the front entrance where we will come and get you.  For questions call [phone].  Thanks        COVID-19 Frequently Asked Questions COVID-19 (coronavirus disease) is an infection that is caused by a large family of viruses. Some viruses cause illness in people and others cause illness in animals like camels, cats, and bats. In some cases, the viruses that cause illness in animals can spread to humans. Where did the coronavirus come from? In December 2019, [place] told the [employer] [facility]) of several cases of lung disease (human respiratory illness). These cases were linked to an open seafood and livestock market in the city of [place]. The link to the seafood and livestock market suggests that the virus may have spread from animals to humans. However, since that first outbreak in December, the virus has also been shown to spread from person to person. What is the name of the disease and the virus? Disease name Early on, this disease was called novel coronavirus. This is because scientists determined that the disease was caused by a new (novel) respiratory virus. The World Health Organization [facility]) has now named the disease COVID-19, or coronavirus disease. Virus name The virus that causes the disease is called severe acute respiratory syndrome coronavirus 2 (SARS-CoV-2). More information on disease and virus naming World Health Organization [facility]): www.who.int/emergencies/diseases/novel-coronavirus-2019/technical-guidance/naming-the-coronavirus-disease-(covid-2019)-and-the-virus-that-causes-it Who is at risk for complications from coronavirus disease? Some people may be at higher risk for complications from  coronavirus disease. This includes older adults and people who have chronic diseases, such as heart disease, diabetes, and lung disease. If you are at higher risk for complications, take these extra precautions:  Stay home as much as possible.  Avoid social gatherings and travel.  Avoid close contact with others. Stay at least 6 ft (2 m) away from others, if possible.  Wash your hands often with soap and water for at least 20 seconds.  Avoid touching your face, mouth, nose, or eyes.  Keep supplies on hand at home, such as food, medicine, and cleaning supplies.  If you must go out in public, wear a cloth face covering or face mask. Make sure your mask covers your nose and mouth. How does coronavirus disease spread? The virus that causes coronavirus disease spreads easily from person to person (is contagious). You may catch the virus by:  Breathing in droplets from an infected person. Droplets can be spread by a person breathing, speaking, singing, coughing, or sneezing.  Touching something, like a table or a doorknob, that was exposed to the virus (contaminated) and then touching your mouth, nose, or eyes. Can I get the virus from touching surfaces or objects? There is still a lot that we do not know about the virus that causes coronavirus disease. Scientists are basing a lot of information on what they know about similar viruses, such as:  Viruses cannot generally survive on surfaces for long. They need a human body (host) to survive.  It is more likely that the virus is spread by close contact with people who are sick (direct contact), such as through: ? Shaking hands or hugging. ? Breathing in respiratory droplets that travel through the air. Droplets can be  spread by a person breathing, speaking, singing, coughing, or sneezing.  It is less likely that the virus is spread when a person touches a surface or object that has the virus on it (indirect contact). The virus may be able to  enter the body if the person touches a surface or object and then touches his or her face, eyes, nose, or mouth. Can a person spread the virus without having symptoms of the disease? It may be possible for the virus to spread before a person has symptoms of the disease, but this is most likely not the main way the virus is spreading. It is more likely for the virus to spread by being in close contact with people who are sick and breathing in the respiratory droplets spread by a person breathing, speaking, singing, coughing, or sneezing. What are the symptoms of coronavirus disease? Symptoms vary from person to person and can range from mild to severe. Symptoms may include:  Fever or chills.  Cough.  Difficulty breathing or feeling short of breath.  Headaches, body aches, or muscle aches.  Runny or stuffy (congested) nose.  Sore throat.  New loss of taste or smell.  Nausea, vomiting, or diarrhea. These symptoms can appear anywhere from 2 to 14 days after you have been exposed to the virus. Some people may not have any symptoms. If you develop symptoms, call your health care provider. People with severe symptoms may need hospital care. Should I be tested for this virus? Your health care provider will decide whether to test you based on your symptoms, history of exposure, and your risk factors. How does a health care provider test for this virus? Health care providers will collect samples to send for testing. Samples may include:  Taking a swab of fluid from the back of your nose and throat, your nose, or your throat.  Taking fluid from the lungs by having you cough up mucus (sputum) into a sterile cup.  Taking a blood sample. Is there a treatment or vaccine for this virus? Currently, there is no vaccine to prevent coronavirus disease. Also, there are no medicines like antibiotics or antivirals to treat the virus. A person who becomes sick is given supportive care, which means rest and  fluids. A person may also relieve his or her symptoms by using over-the-counter medicines that treat sneezing, coughing, and runny nose. These are the same medicines that a person takes for the common cold. If you develop symptoms, call your health care provider. People with severe symptoms may need hospital care. What can I do to protect myself and my family from this virus?     You can protect yourself and your family by taking the same actions that you would take to prevent the spread of other viruses. Take the following actions:  Wash your hands often with soap and water for at least 20 seconds. If soap and water are not available, use alcohol-based hand sanitizer.  Avoid touching your face, mouth, nose, or eyes.  Cough or sneeze into a tissue, sleeve, or elbow. Do not cough or sneeze into your hand or the air. ? If you cough or sneeze into a tissue, throw it away immediately and wash your hands.  Disinfect objects and surfaces that you frequently touch every day.  Stay away from people who are sick.  Avoid going out in public, follow guidance from your state and local health authorities.  Avoid crowded indoor spaces. Stay at least 6 ft (2 m) away  from others.  If you must go out in public, wear a cloth face covering or face mask. Make sure your mask covers your nose and mouth.  Stay home if you are sick, except to get medical care. Call your health care provider before you get medical care. Your health care provider will tell you how long to stay home.  Make sure your vaccines are up to date. Ask your health care provider what vaccines you need. What should I do if I need to travel? Follow travel recommendations from your local health authority, the CDC, and WHO. Travel information and advice  Centers for Disease Control and Prevention (CDC): GeminiCard.gl  World Health Organization [facility]):  PreviewDomains.se Know the risks and take action to protect your health  You are at higher risk of getting coronavirus disease if you are traveling to areas with an outbreak or if you are exposed to travelers from areas with an outbreak.  Wash your hands often and practice good hygiene to lower the risk of catching or spreading the virus. What should I do if I am sick? General instructions to stop the spread of infection  Wash your hands often with soap and water for at least 20 seconds. If soap and water are not available, use alcohol-based hand sanitizer.  Cough or sneeze into a tissue, sleeve, or elbow. Do not cough or sneeze into your hand or the air.  If you cough or sneeze into a tissue, throw it away immediately and wash your hands.  Stay home unless you must get medical care. Call your health care provider or local health authority before you get medical care.  Avoid public areas. Do not take public transportation, if possible.  If you can, wear a mask if you must go out of the house or if you are in close contact with someone who is not sick. Make sure your mask covers your nose and mouth. Keep your home clean  Disinfect objects and surfaces that are frequently touched every day. This may include: ? Counters and tables. ? Doorknobs and light switches. ? Sinks and faucets. ? Electronics such as phones, remote controls, keyboards, computers, and tablets.  Wash dishes in hot, soapy water or use a dishwasher. Air-dry your dishes.  Wash laundry in hot water. Prevent infecting other household members  Let healthy household members care for children and pets, if possible. If you have to care for children or pets, wash your hands often and wear a mask.  Sleep in a different bedroom or bed, if possible.  Do not share personal items, such as razors, toothbrushes, deodorant, combs, brushes, towels, and washcloths. Where to find  more information Centers for Disease Control and Prevention (CDC)  Information and news updates: CardRetirement.cz World Health Organization [facility])  Information and news updates: AffordableSalon.es  Coronavirus health topic: https://thompson-craig.com/  Questions and answers on COVID-19: kruiseway.com  Global tracker: who.sprinklr.com American Academy of Pediatrics (AAP)  Information for families: www.healthychildren.org/English/health-issues/conditions/chest-lungs/Pages/2019-Novel-Coronavirus.aspx The coronavirus situation is changing rapidly. Check your local health authority website or the CDC and [facility] websites for updates and news. When should I contact a health care provider?  Contact your health care provider if you have symptoms of an infection, such as fever or cough, and you: ? Have been near anyone who is known to have coronavirus disease. ? Have come into contact with a person who is suspected to have coronavirus disease. ? Have traveled to an area where there is an outbreak of COVID-19. When should I  get emergency medical care?  Get help right away by calling your local emergency services (911 in the [place] if you have: ? Trouble breathing. ? Pain or pressure in your chest. ? Confusion. ? Blue-tinged lips and fingernails. ? Difficulty waking from sleep. ? Symptoms that get worse. Let the emergency medical personnel know if you think you have coronavirus disease. Summary  A new respiratory virus is spreading from person to person and causing COVID-19 (coronavirus disease).  The virus that causes COVID-19 appears to spread easily. It spreads from one person to another through droplets from breathing, speaking, singing, coughing, or sneezing.  Older adults and those with chronic diseases are at higher risk of disease. If you are at higher risk for complications, take extra  precautions.  There is currently no vaccine to prevent coronavirus disease. There are no medicines, such as antibiotics or antivirals, to treat the virus.  You can protect yourself and your family by washing your hands often, avoiding touching your face, and covering your coughs and sneezes. This information is not intended to replace advice given to you by your health care provider. Make sure you discuss any questions you have with your health care provider. Document Revised: 08/27/2019 Document Reviewed: 02/23/2019 Elsevier Patient Education  [employer].  COVID-19: How to Protect Yourself and Others Know how it spreads  There is currently no vaccine to prevent coronavirus disease 2019 (COVID-19).  The best way to prevent illness is to avoid being exposed to this virus.  The virus is thought to spread mainly from person-to-person. ? Between people who are in close contact with one another (within about 6 feet). ? Through respiratory droplets produced when an infected person coughs, sneezes or talks. ? These droplets can land in the mouths or noses of people who are nearby or possibly be inhaled into the lungs. ? COVID-19 may be spread by people who are not showing symptoms. Everyone should Clean your hands often  Wash your hands often with soap and water for at least 20 seconds especially after you have been in a public place, or after blowing your nose, coughing, or sneezing.  If soap and water are not readily available, use a hand sanitizer that contains at least 60% alcohol. Cover all surfaces of your hands and rub them together until they feel dry.  Avoid touching your eyes, nose, and mouth with unwashed hands. Avoid close contact  Limit contact with others as much as possible.  Avoid close contact with people who are sick.  Put distance between yourself and other people. ? Remember that some people without symptoms may be able to spread virus. ? This is especially  important for people who are at higher risk of getting very GainPain.com.cy Cover your mouth and nose with a mask when around others  You could spread COVID-19 to others even if you do not feel sick.  Everyone should wear a mask in public settings and when around people not living in their household, especially when social distancing is difficult to maintain. ? Masks should not be placed on young children under age 27, anyone who has trouble breathing, or is unconscious, incapacitated or otherwise unable to remove the mask without assistance.  The mask is meant to protect other people in case you are infected.  Do NOT use a facemask meant for a Dietitian.  Continue to keep about 6 feet between yourself and others. The mask is not a substitute for social distancing. Cover coughs and sneezes  Always cover your mouth and nose with a tissue when you cough or sneeze or use the inside of your elbow.  Throw used tissues in the trash.  Immediately wash your hands with soap and water for at least 20 seconds. If soap and water are not readily available, clean your hands with a hand sanitizer that contains at least 60% alcohol. Clean and disinfect  Clean AND disinfect frequently touched surfaces daily. This includes tables, doorknobs, light switches, countertops, handles, desks, phones, keyboards, toilets, faucets, and sinks. ktimeonline.com  If surfaces are dirty, clean them: Use detergent or soap and water prior to disinfection.  Then, use a household disinfectant. You can see a list of EPA-registered household disinfectants here. SouthAmericaFlowers.co.uk 07/14/2019 This information is not intended to replace advice given to you by your health care provider. Make sure you discuss any questions you have with your health care provider. Document Revised:  07/22/2019 Document Reviewed: 05/20/2019 Elsevier Patient Education  2020 [employer].

## 2020-02-17 NOTE — Progress Notes (Signed)
Patient scheduled for outpatient Remdesivir infusion at 10:00 AM on Friday 4/9, Saturday 4/10 and Sunday 4/11.  Please advise them to report to [facility] at [address]  Drive to the security guard and tell them you are here for an infusion. They will direct you to the front entrance where we will come and get you.  For questions call [phone].  Thanks

## 2020-02-18 ENCOUNTER — Ambulatory Visit (HOSPITAL_COMMUNITY)
Admission: RE | Admit: 2020-02-18 | Discharge: 2020-02-18 | Disposition: A | Discharge status: Home / Self Care | Payer: BC Managed Care – PPO | Source: Ambulatory Visit | Attending: Pulmonary Disease | Admitting: Pulmonary Disease

## 2020-02-18 DIAGNOSIS — U071 COVID-19: Secondary | ICD-10-CM | POA: Diagnosis present

## 2020-02-18 DIAGNOSIS — J1282 Pneumonia due to coronavirus disease 2019: Secondary | ICD-10-CM | POA: Insufficient documentation

## 2020-02-18 MED ORDER — FAMOTIDINE IN NACL 20-0.9 MG/50ML-% IV SOLN: 20.0000 mg | Freq: Once | INTRAVENOUS | Status: DC | PRN

## 2020-02-18 MED ORDER — ALBUTEROL SULFATE HFA 108 (90 BASE) MCG/ACT IN AERS: 2.0000 | INHALATION_SPRAY | Freq: Once | RESPIRATORY_TRACT | Status: DC | PRN

## 2020-02-18 MED ORDER — METHYLPREDNISOLONE SODIUM SUCC 125 MG IJ SOLR: 125.0000 mg | Freq: Once | INTRAMUSCULAR | Status: DC | PRN

## 2020-02-18 MED ORDER — SODIUM CHLORIDE 0.9 % IV SOLN: INTRAVENOUS | Status: DC | PRN

## 2020-02-18 MED ORDER — DIPHENHYDRAMINE HCL 50 MG/ML IJ SOLN: 50.0000 mg | Freq: Once | INTRAMUSCULAR | Status: DC | PRN

## 2020-02-18 MED ORDER — EPINEPHRINE 0.3 MG/0.3ML IJ SOAJ: 0.3000 mg | Freq: Once | INTRAMUSCULAR | Status: DC | PRN

## 2020-02-18 MED ORDER — SODIUM CHLORIDE 0.9 % IV SOLN
100.0000 mg | Freq: Once | INTRAVENOUS | Status: AC
  Administered 2020-02-18: 100 mg via INTRAVENOUS

## 2020-02-18 NOTE — Progress Notes (Signed)
  Diagnosis: COVID-19  Physician: Dr. Wright  Procedure: [facility] Med: remdesivir infusion - Provided patient with remdesivir fact sheet for patients, parents and caregivers prior to infusion.  Complications: No immediate complications noted.  Discharge: Discharged home   Bobby Montgomery 02/18/2020   

## 2020-02-18 NOTE — Discharge Summary (Signed)
[name] PIR:[id number] DOB: [birth year] DOA: 02/15/2020  PCP: System, Pcp Not In  Admit date: 02/15/2020  Discharge date: 02/18/2020  Admitted From: Home   disposition: Home   Recommendations for Outpatient Follow-up:   Follow up with PCP in 1-2 weeks  Home Health: None Equipment/Devices: None Consultations: None Discharge Condition: Improved CODE STATUS: Full Diet Recommendation: Heart Healthy   Diet Order            Diet - low sodium heart healthy               No chief complaint on file.    Brief history of present illness from the day of admission and additional interim summary     Bobby Montgomery is a 39 y.o. male with medical history significant for type 2 diabetes, hypertension, CKD stage III, chronic venous insufficiency with thrombophlebitis who presents to the ED for evaluation of fevers, chills, body aches, and shortness of breath.  Patient reports about 3 to 4 days of fatigue, low energy, decreased appetite and poor oral intake.  He has been having some body aches and generalized weakness.  He reports chills but denies subjective fevers or diaphoresis.  He has had some lightheadedness without syncope or fall.  He reports nausea without emesis.  He had some shortness of breath earlier today but none currently.  He checked his oxygen status at home and was noted to have low oxygen saturation down to 76% on room air.  He denies any chest pain, cough, abdominal pain, dysuria, or diarrhea.  He presented to the ED for further evaluation.  Initial vitals showed BP 163/89, pulse 87, RR 18, temp 101 Fahrenheit, SPO2 90% on room air. Labs are notable for BUN 42, creatinine 3.63 (last creatinine 2.37 on 01/25/2020), sodium 141, potassium 4.2, bicarb 27, WBC 6.5, hemoglobin 11.7, platelets 162,000,  high-sensitivity troponin I 126, procalcitonin <0.10, lactic acid 1.1.  POC SARS-CoV-2 antigen test is positive.  Blood cultures were obtained and pending.                                                                  Hospital Course   Patient was admitted for Covid pneumonia complicated by acute kidney injury on top of CKD 3.  Patient was treated with gentle IV fluid resuscitation, oxygen supplementation and started on remdesivir and steroids.  Patient improved overnight with much decreased shortness of breath and improved creatinine. He was able to be weaned off his oxygen but he still felt weak.  Patient was continued with conservative management as well as continued steroids and remdesivir and was discharged 2 days after admission feeling much improved.  He is to complete his remdesivir treatment with outpatient remdesivir at [facility].  He is discharged with steroids to complete a 10-day course  of steroids.   Acute hypoxemic respiratory failure due to COVID-19 pneumonitis: SARS-CoV-2 antigen positive 02/15/2020.  Initial CRP 1.7, pro calcitonin less than 0.10 On day of discharge patient is ambulating without oxygen without desaturations. Patient is to discharge home to complete his remdesivir as an outpatient at [facility] Prescription given to complete 10-day course of steroids. Continue as needed inhaled bronchodilators   AKI on CKD stage III: Creatinine improved with IV fluid resuscitation and holding of losartan, HCTZ, Metformin and Lasix. Initial creatinine of 3.63 had decreased to 2.26 which is his baseline on day of discharge Discussed with patient's nephrologist who noted he would follow-up with the patient as an outpatient.  Elevated troponin: High-sensitivity troponin I 126, EKG without concerning changes. Possibly minimal demand ischemia in setting of acute hypoxic respiratory failure and AKI on CKD stage III. No further work-up is  warranted.  Hypertension: Antihypertensives are restarted upon discharge however patient is on multiple medications and was instructed to discuss these with his PCP.  Patient states he would discuss with both PCP and renal the multiple antihypertensives that he is on and try to consolidate them.  Type 2 diabetes: Restart Metformin and glimepiride on discharge  Anemia Microcytic anemia with high RDW, possibly secondary to CKD 3  Chronic venous insufficiency and thrombophlebitis: Chronic and stable, no lower extremity edema at time of admission.  Can place compression stockings if needed.  Mood disorder: Continue Lamictal and trazodone   Discharge diagnosis     Principal Problem:   Acute hypoxemic respiratory failure due to COVID-19 [facility]) Active Problems:   Hypertension associated with diabetes ([place])   Type 2 diabetes mellitus ([place])   Acute kidney injury superimposed on CKD [facility])    Discharge instructions    Discharge Instructions    MyChart COVID-19 home monitoring program   Complete by: Feb 17, 2020    Is the patient willing to use the [place] for home monitoring?: No   Diet - low sodium heart healthy   Complete by: As directed    Discharge instructions   Complete by: As directed    1.  Take prednisone 40 mg every day for the next 8 days. 2.  You will need to go to [facility] for the next 3 days to get your remdesivir which is the anti-Covid medication.  Transportation can be arranged for you if you cannot get there yourself. 3.  Use the inhaler that I am giving you if you need it for shortness of breath or coughing. 4.  Remember to stay isolated until 10 days after your diagnosis of Covid. 5.  See your PCP in 2 weeks to make sure you are still doing well. 6.  Call your PCP if you become more short of breath   Increase activity slowly   Complete by: As directed       Discharge Medications   Allergies as of 02/17/2020   No Known  Allergies     Medication List    STOP taking these medications   oseltamivir 75 MG capsule Commonly known as: Tamiflu     TAKE these medications   allopurinol 100 MG tablet Commonly known as: ZYLOPRIM   amLODipine 10 MG tablet Commonly known as: NORVASC Take by mouth.   aspirin 81 MG EC tablet   calcitRIOL 0.25 MCG capsule Commonly known as: ROCALTROL   cloNIDine 0.3 mg/24hr patch Commonly known as: CATAPRES - Dosed in mg/24 hr Place onto the skin.   cloNIDine  0.3 mg/24hr patch Commonly known as: CATAPRES - Dosed in mg/24 hr Place onto the skin.   fluticasone 50 MCG/ACT nasal spray Commonly known as: Flonase Place 2 sprays into both nostrils daily.   furosemide 40 MG tablet Commonly known as: LASIX Take by mouth.   glimepiride 4 MG tablet Commonly known as: AMARYL Take by mouth.   hydrALAZINE 100 MG tablet Commonly known as: APRESOLINE Take 100 mg by mouth 2 (two) times daily.   hydrochlorothiazide 25 MG tablet Commonly known as: HYDRODIURIL Take by mouth.   Ipratropium-Albuterol 20-100 MCG/ACT Aers respimat Commonly known as: COMBIVENT Inhale 1 puff into the lungs every 6 (six) hours as needed for up to 8 days for shortness of breath.   losartan 100 MG tablet Commonly known as: COZAAR Take by mouth.   metFORMIN 500 MG 24 hr tablet Commonly known as: GLUCOPHAGE-XR metformin ER 500 mg tablet,extended release 24 hr  TAKE 2 TABLETS (1,000 MG TOTAL) BY MOUTH TWO (2) TIMES A DAY.   metoprolol succinate 100 MG 24 hr tablet Commonly known as: TOPROL-XL Take by mouth.   minoxidil 10 MG tablet Commonly known as: LONITEN Take by mouth.   pantoprazole 40 MG tablet Commonly known as: PROTONIX pantoprazole 40 mg tablet,delayed release  TAKE 1 TABLET BY MOUTH EVERY DAY   predniSONE 10 MG tablet Commonly known as: DELTASONE Take 4 tablets (40 mg total) by mouth daily for 8 days.   promethazine-dextromethorphan 6.25-15 MG/5ML syrup Commonly known as:  PROMETHAZINE-DM Take 5 mLs by mouth 4 (four) times daily as needed for cough.   Relief Knee Misc Apply in the morning and remove at night.   traZODone 50 MG tablet Commonly known as: DESYREL Take by mouth.         Major procedures and Radiology Reports - PLEASE review detailed and final reports thoroughly  -      DG Chest Port 1 View  Result Date: 02/15/2020 CLINICAL DATA:  Chest pain. EXAM: PORTABLE CHEST 1 VIEW COMPARISON:  November 06, 2009 FINDINGS: Very mild, hazy areas of atelectasis and/or early infiltrate are seen within the bilateral lung bases and mid left lung. There is no evidence of a pleural effusion or pneumothorax. The cardiac silhouette is mildly enlarged. The visualized skeletal structures are unremarkable. IMPRESSION: Very mild, hazy bibasilar and mid left lung atelectasis and/or early infiltrate. Electronically Signed   By: Aram Candela M.D.   On: 02/15/2020 20:10    Micro Results    Recent Results (from the past 240 hour(s))  Blood Culture (routine x 2)     Status: None (Preliminary result)   Collection Time: 02/15/20  8:29 PM   Specimen: BLOOD  Result Value Ref Range Status   Specimen Description BLOOD LEFT ANTECUBITAL  Final   Special Requests   Final    BOTTLES DRAWN AEROBIC AND ANAEROBIC Blood Culture adequate volume   Culture   Final    NO GROWTH 2 DAYS Performed at [facility], [address], [place], [place] [address]    Report Status PENDING  Incomplete  Blood Culture (routine x 2)     Status: None (Preliminary result)   Collection Time: 02/15/20  8:29 PM   Specimen: BLOOD  Result Value Ref Range Status   Specimen Description BLOOD RIGHT ANTECUBITAL  Final   Special Requests   Final    BOTTLES DRAWN AEROBIC AND ANAEROBIC Blood Culture adequate volume   Culture   Final    NO GROWTH 2 DAYS Performed at  [facility], [address] [place], [place] [address]    Report Status PENDING  Incomplete    Today    Subjective    Bobby Montgomery feels much improved since admission.  Feels ready to go home.  Denies chest pain, shortness of breath or abdominal pain.    Objective   Blood pressure (!) 153/75, pulse 73, temperature 98.6 F (37 C), temperature source Oral, resp. rate (!) 22, height 6\' 2"  (1.88 m), weight (!) 163.3 kg, SpO2 98 %.  No intake or output data in the 24 hours ending 02/18/20 1648  Exam General: Patient appears well and in good spirits sitting up in bed in no acute distress.  Eyes: sclera anicteric, conjuctiva mild injection bilaterally CVS: S1-S2, regular  Respiratory:  decreased air entry bilaterally secondary to decreased inspiratory effort, rales at bases  GI: NABS, soft, NT  LE: No edema.  Neuro: A/O x 3, Moving all extremities equally with normal strength, CN 3-12 intact, grossly nonfocal.  Psych: patient is logical and coherent, judgement and insight appear normal, mood and affect appropriate to situation.    Data Review   CBC w Diff:  Lab Results  Component Value Date   WBC 5.2 02/17/2020   HGB 10.9 (L) 02/17/2020   HGB 13.2 07/25/2012   HCT 32.9 (L) 02/17/2020   HCT 37.2 (L) 07/27/2012   PLT 169 02/17/2020   PLT 173 07/25/2012   LYMPHOPCT 13 02/17/2020   LYMPHOPCT 26.2 07/25/2012   MONOPCT 4 02/17/2020   MONOPCT 6.0 07/25/2012   EOSPCT 0 02/17/2020   EOSPCT 2.0 07/25/2012   BASOPCT 0 02/17/2020   BASOPCT 0.6 07/25/2012    CMP:  Lab Results  Component Value Date   NA 139 02/17/2020   NA 141 07/27/2012   K 4.8 02/17/2020   K 3.7 07/27/2012   CL 108 02/17/2020   CL 106 07/27/2012   CO2 25 02/17/2020   CO2 25 07/27/2012   BUN 37 (H) 02/17/2020   BUN 19 (H) 07/27/2012   CREATININE 2.26 (H) 02/17/2020   CREATININE 1.61 (H) 07/27/2012   PROT 7.8 02/17/2020   PROT 9.2 (H) 07/24/2012   ALBUMIN 4.1 02/17/2020   ALBUMIN 4.1 07/24/2012   BILITOT 0.6 02/17/2020   BILITOT 0.8 07/24/2012   ALKPHOS 57 02/17/2020   ALKPHOS 101 07/24/2012   AST  18 02/17/2020   AST 35 07/24/2012   ALT 24 02/17/2020   ALT 56 07/24/2012  .   Total Time in preparing paper work, data evaluation and todays exam - 35 minutes  07/26/2012 M.D on 02/18/2020 at 4:48 PM  [facility]   Office  [phone]

## 2020-02-19 ENCOUNTER — Ambulatory Visit (HOSPITAL_COMMUNITY)
Admit: 2020-02-19 | Discharge: 2020-02-19 | Disposition: A | Discharge status: Home / Self Care | Payer: BC Managed Care – PPO | Attending: Pulmonary Disease | Admitting: Pulmonary Disease

## 2020-02-19 DIAGNOSIS — U071 COVID-19: Secondary | ICD-10-CM | POA: Diagnosis not present

## 2020-02-19 MED ORDER — ALBUTEROL SULFATE HFA 108 (90 BASE) MCG/ACT IN AERS: 2.0000 | INHALATION_SPRAY | Freq: Once | RESPIRATORY_TRACT | Status: DC | PRN

## 2020-02-19 MED ORDER — SODIUM CHLORIDE 0.9 % IV SOLN
100.0000 mg | Freq: Once | INTRAVENOUS | Status: AC
  Administered 2020-02-19: 100 mg via INTRAVENOUS
  Filled 2020-02-19: qty 20

## 2020-02-19 MED ORDER — FAMOTIDINE IN NACL 20-0.9 MG/50ML-% IV SOLN: 20.0000 mg | Freq: Once | INTRAVENOUS | Status: DC | PRN

## 2020-02-19 MED ORDER — DIPHENHYDRAMINE HCL 50 MG/ML IJ SOLN: 50.0000 mg | Freq: Once | INTRAMUSCULAR | Status: DC | PRN

## 2020-02-19 MED ORDER — EPINEPHRINE 0.3 MG/0.3ML IJ SOAJ: 0.3000 mg | Freq: Once | INTRAMUSCULAR | Status: DC | PRN

## 2020-02-19 MED ORDER — METHYLPREDNISOLONE SODIUM SUCC 125 MG IJ SOLR: 125.0000 mg | Freq: Once | INTRAMUSCULAR | Status: DC | PRN

## 2020-02-19 MED ORDER — SODIUM CHLORIDE 0.9 % IV SOLN: INTRAVENOUS | Status: DC | PRN

## 2020-02-19 NOTE — Progress Notes (Signed)
  Diagnosis: COVID-19  Physician: Dr. Wright  Procedure: [facility] Med: remdesivir infusion - Provided patient with remdesivir fact sheet for patients, parents and caregivers prior to infusion.  Complications: No immediate complications noted.  Discharge: Discharged home   Jhoselyn Ruffini 02/19/2020   

## 2020-02-20 ENCOUNTER — Ambulatory Visit (HOSPITAL_COMMUNITY)
Admit: 2020-02-20 | Discharge: 2020-02-20 | Disposition: A | Discharge status: Home / Self Care | Payer: BC Managed Care – PPO | Attending: Pulmonary Disease | Admitting: Pulmonary Disease

## 2020-02-20 DIAGNOSIS — U071 COVID-19: Secondary | ICD-10-CM | POA: Diagnosis not present

## 2020-02-20 LAB — CULTURE, BLOOD (ROUTINE X 2)
Culture: NO GROWTH
Culture: NO GROWTH
Special Requests: ADEQUATE
Special Requests: ADEQUATE

## 2020-02-20 MED ORDER — SODIUM CHLORIDE 0.9 % IV SOLN: INTRAVENOUS | Status: DC | PRN

## 2020-02-20 MED ORDER — DIPHENHYDRAMINE HCL 50 MG/ML IJ SOLN: 50.0000 mg | Freq: Once | INTRAMUSCULAR | Status: DC | PRN

## 2020-02-20 MED ORDER — METHYLPREDNISOLONE SODIUM SUCC 125 MG IJ SOLR: 125.0000 mg | Freq: Once | INTRAMUSCULAR | Status: DC | PRN

## 2020-02-20 MED ORDER — ALBUTEROL SULFATE HFA 108 (90 BASE) MCG/ACT IN AERS: 2.0000 | INHALATION_SPRAY | Freq: Once | RESPIRATORY_TRACT | Status: DC | PRN

## 2020-02-20 MED ORDER — SODIUM CHLORIDE 0.9 % IV SOLN
100.0000 mg | Freq: Once | INTRAVENOUS | Status: AC
  Administered 2020-02-20: 100 mg via INTRAVENOUS
  Filled 2020-02-20: qty 20

## 2020-02-20 MED ORDER — EPINEPHRINE 0.3 MG/0.3ML IJ SOAJ: 0.3000 mg | Freq: Once | INTRAMUSCULAR | Status: DC | PRN

## 2020-02-20 MED ORDER — FAMOTIDINE IN NACL 20-0.9 MG/50ML-% IV SOLN: 20.0000 mg | Freq: Once | INTRAVENOUS | Status: DC | PRN

## 2020-02-20 NOTE — Progress Notes (Signed)
  Diagnosis: COVID-19  Physician: Dr. Delford Field  Procedure: [facility]fusion [facility] Med: remdesivir infusion - Provided patient with remdesivir fact sheet for patients, parents and caregivers prior to infusion.  Complications: No immediate complications noted.  Discharge: Discharged home   Essie Hart 02/20/2020

## 2020-02-22 ENCOUNTER — Other Ambulatory Visit
Admission: RE | Admit: 2020-02-22 | Discharge: 2020-02-22 | Disposition: A | Discharge status: Home / Self Care | Payer: BC Managed Care – PPO | Source: Ambulatory Visit | Attending: Nephrology | Admitting: Nephrology

## 2020-02-22 DIAGNOSIS — N1832 Chronic kidney disease, stage 3b: Secondary | ICD-10-CM | POA: Diagnosis not present

## 2020-02-22 DIAGNOSIS — E1122 Type 2 diabetes mellitus with diabetic chronic kidney disease: Secondary | ICD-10-CM | POA: Diagnosis present

## 2020-02-22 LAB — BASIC METABOLIC PANEL
Anion gap: 9 (ref 5–15)
BUN: 59 mg/dL — ABNORMAL HIGH (ref 6–20)
CO2: 25 mmol/L (ref 22–32)
Calcium: 9.4 mg/dL (ref 8.9–10.3)
Chloride: 108 mmol/L (ref 98–111)
Creatinine, Ser: 2.58 mg/dL — ABNORMAL HIGH (ref 0.61–1.24)
GFR calc Af Amer: 35 mL/min — ABNORMAL LOW (ref >60)
GFR calc non Af Amer: 30 mL/min — ABNORMAL LOW (ref >60)
Glucose, Bld: 158 mg/dL — ABNORMAL HIGH (ref 70–99)
Potassium: 4.5 mmol/L (ref 3.5–5.1)
Sodium: 142 mmol/L (ref 135–145)

## 2020-06-15 IMAGING — US US RENAL
1 series · 14 of 25 positions shown · non-contrast
Comparison: None.

CLINICAL DATA: Chronic renal disease

EXAM:
RENAL / URINARY TRACT ULTRASOUND COMPLETE

[Series 1: us renal · 0.26mm/px · 14 of 32 slices shown]
[im 1/32]
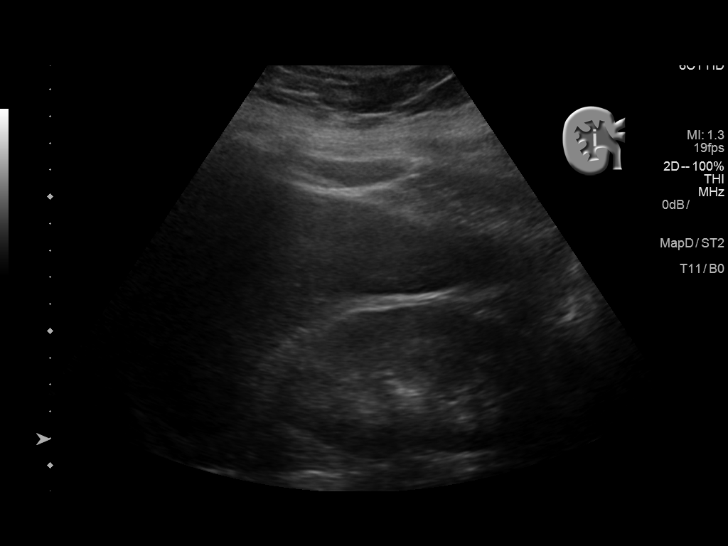
[im 3/32]
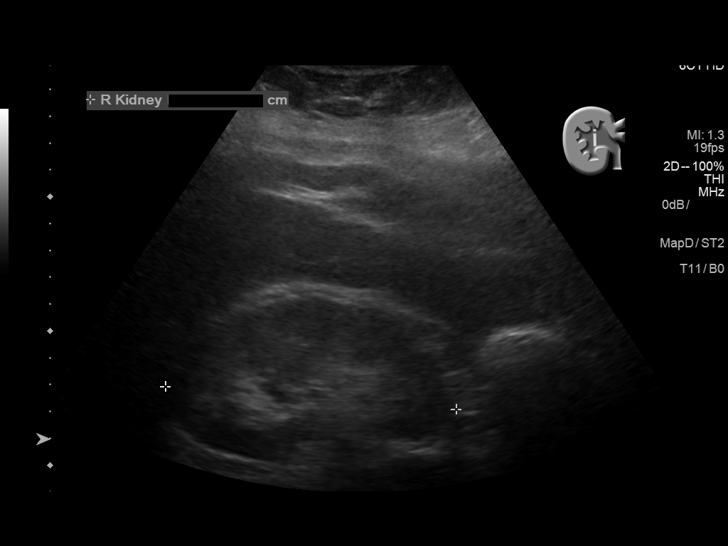
[im 6/32]
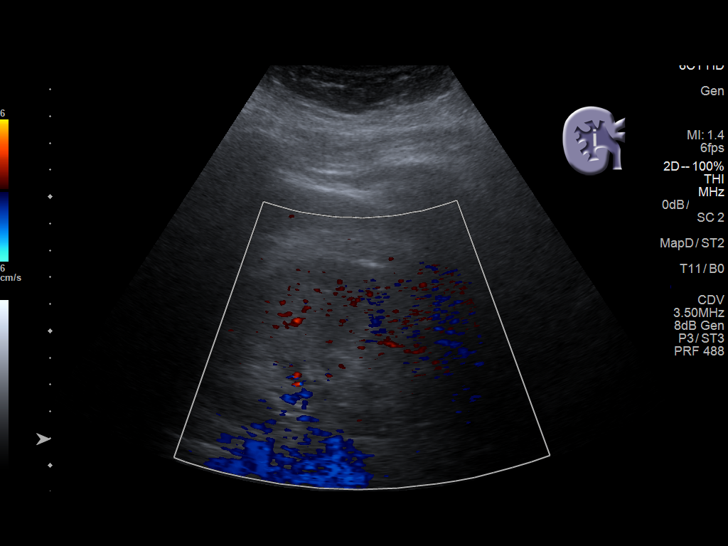
[im 8/32]
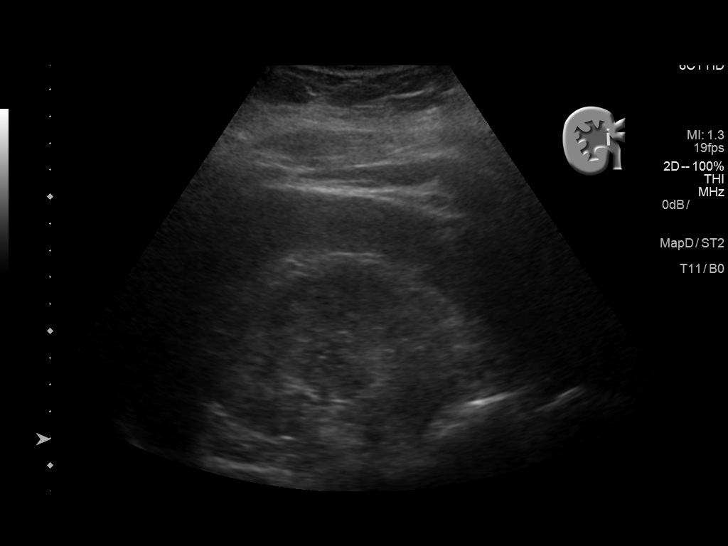
[im 11/32]
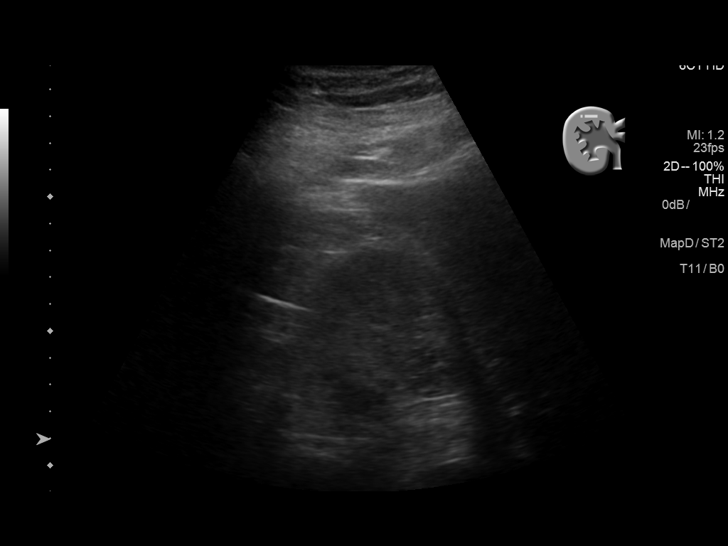
[im 12/32]
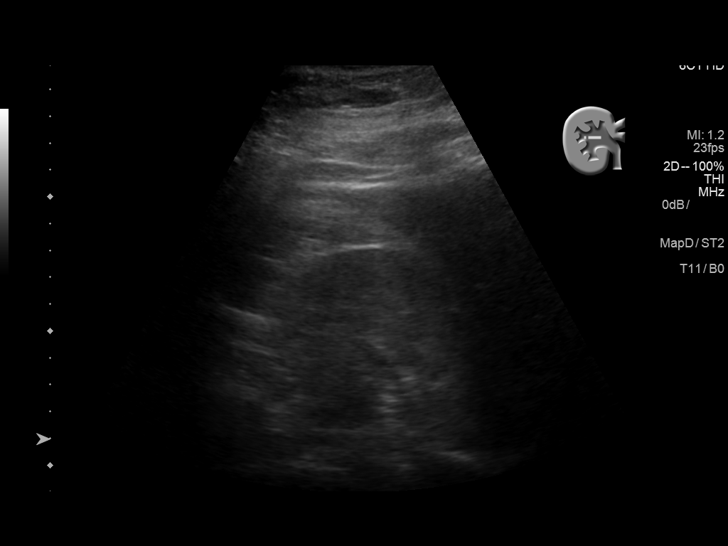
[im 15/32]
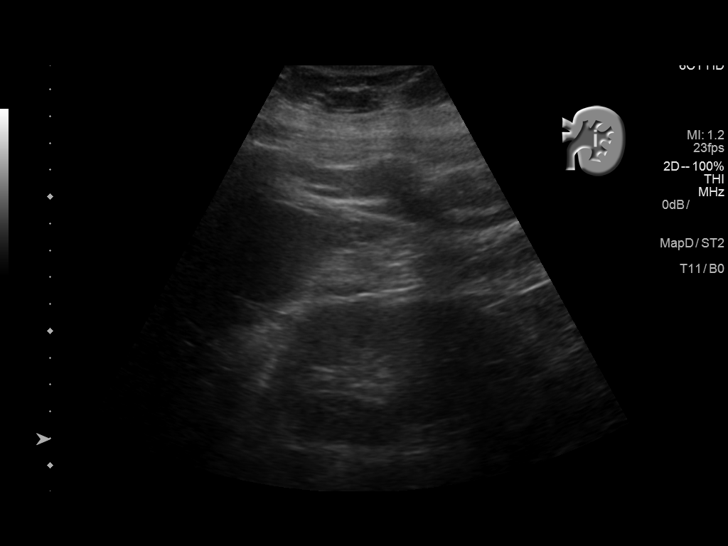
[im 17/32]
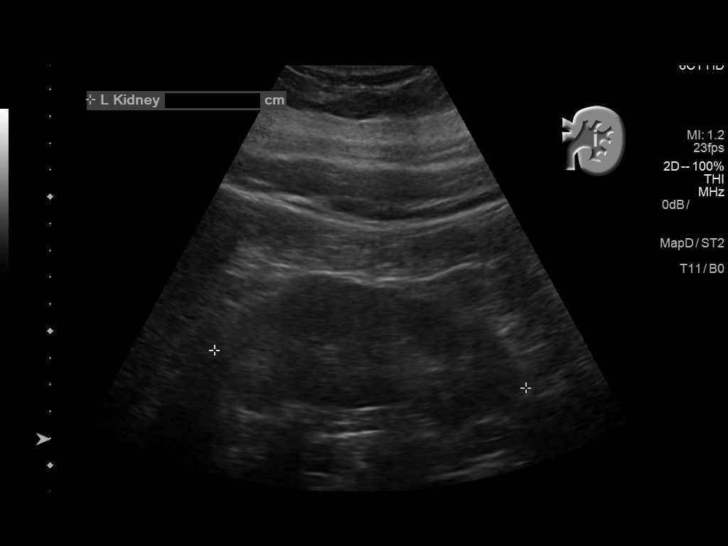
[im 20/32]
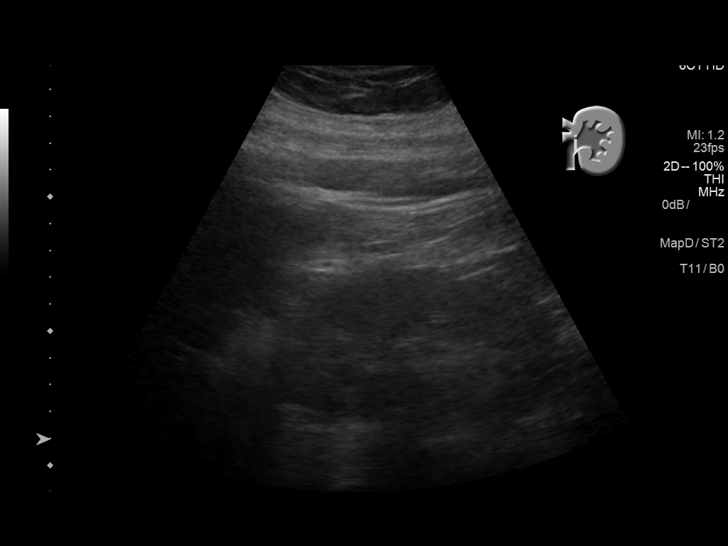
[im 21/32]
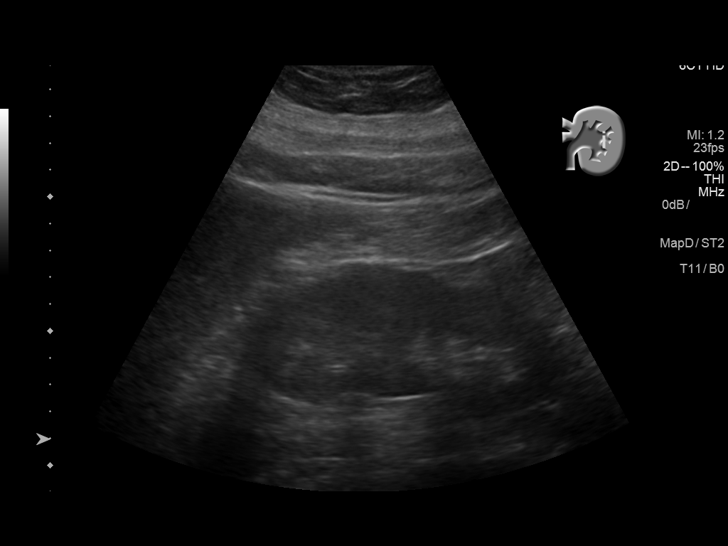
[im 24/32]
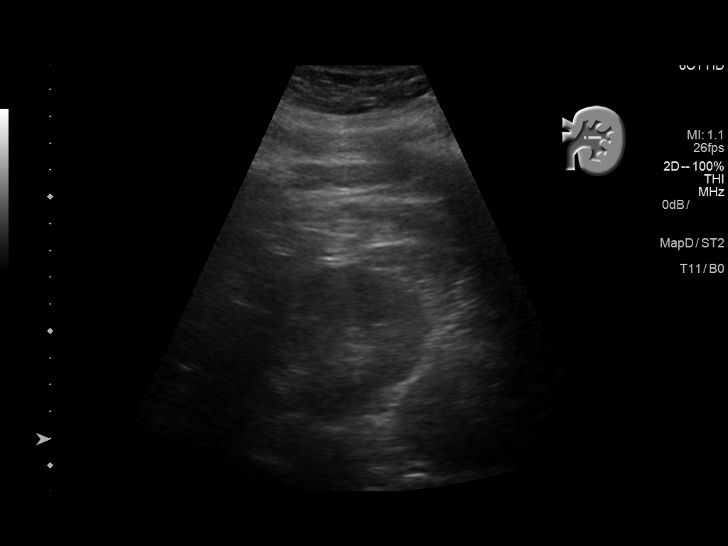
[im 26/32]
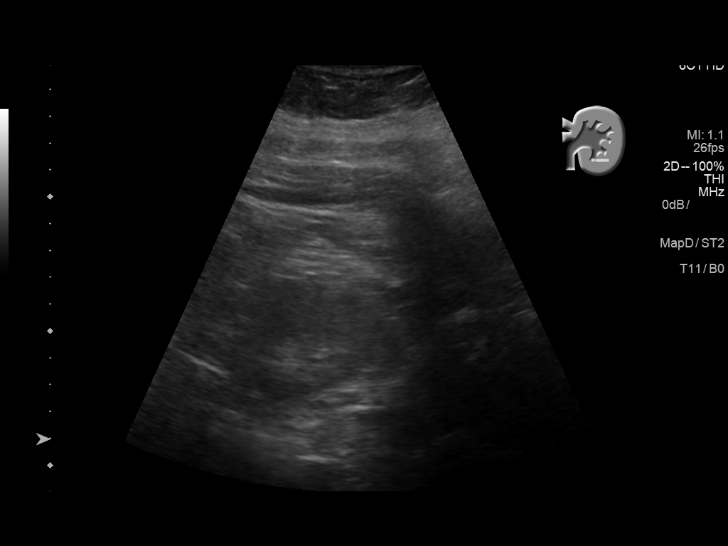
[im 29/32]
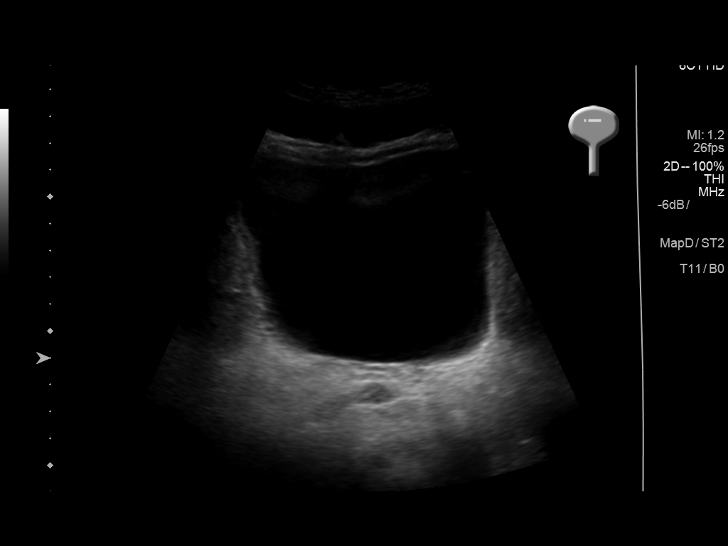
[im 32/32]
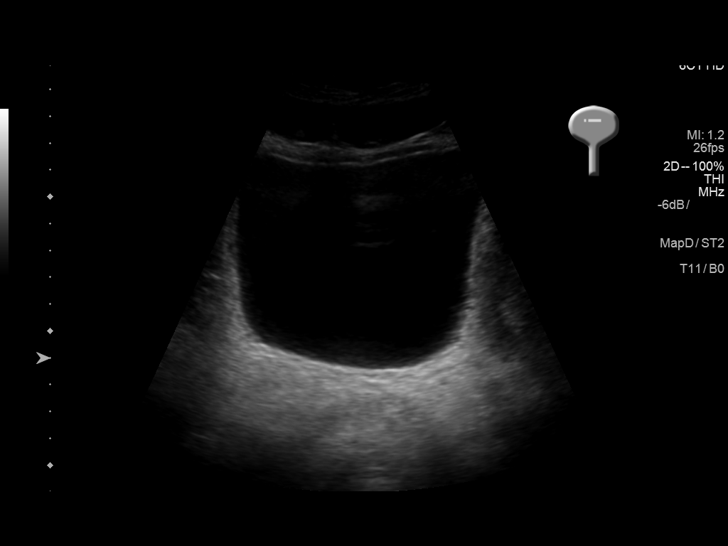

[14 of 25 positions shown; findings below may reference images not displayed]

FINDINGS: Right Kidney:

Length: 10.9 cm.. Echogenicity within normal limits. No mass or
hydronephrosis visualized.

Left Kidney:

Length: 11.7 cm.. Echogenicity within normal limits. No mass or
hydronephrosis visualized.

Bladder:

Appears normal for degree of bladder distention.
IMPRESSION: No acute abnormality noted.

## 2021-08-30 ENCOUNTER — Ambulatory Visit (INDEPENDENT_AMBULATORY_CARE_PROVIDER_SITE_OTHER): Payer: BC Managed Care – PPO

## 2021-08-30 ENCOUNTER — Ambulatory Visit: Payer: BC Managed Care – PPO | Admitting: Podiatry

## 2021-08-30 ENCOUNTER — Other Ambulatory Visit: Payer: Self-pay

## 2021-08-30 DIAGNOSIS — Q666 Other congenital valgus deformities of feet: Secondary | ICD-10-CM

## 2021-08-30 DIAGNOSIS — M79671 Pain in right foot: Secondary | ICD-10-CM

## 2021-08-30 DIAGNOSIS — M7751 Other enthesopathy of right foot: Secondary | ICD-10-CM

## 2021-09-05 ENCOUNTER — Encounter: Payer: Self-pay | Admitting: Podiatry

## 2021-09-05 NOTE — Progress Notes (Signed)
Subjective:  Patient ID: Bobby Montgomery, male    DOB: Apr 01, 1981,  MRN: [id number]  Chief Complaint  Patient presents with   Toe Pain    Right foot pain under toes     40 y.o. male presents with the above complaint.  Patient presents with complaint of right second metatarsophalangeal joint capsulitis.  Patient states painful to touch painful with ambulation.  Has progressively gotten worse.  Hurts more than the one spot.  He has flatfoot deformities monitor for 2 weeks pain is 7 out of 10 sharp shooting in nature.  He denies seeing anyone else prior to seeing me for this.  He is tried no other treatment options based on making some changes to the insoles   Review of Systems: Negative except as noted in the HPI. Denies N/V/F/Ch.  Past Medical History:  Diagnosis Date   CKD (chronic kidney disease), stage III (HCC)    Hypertension associated with diabetes (HCC)    Type 2 diabetes mellitus (HCC)     Current Outpatient Medications:    allopurinol (ZYLOPRIM) 100 MG tablet, , Disp: , Rfl:    amLODipine (NORVASC) 10 MG tablet, Take by mouth., Disp: , Rfl:    aspirin 81 MG EC tablet, , Disp: , Rfl:    calcitRIOL (ROCALTROL) 0.25 MCG capsule, , Disp: , Rfl:    cloNIDine (CATAPRES - DOSED IN MG/24 HR) 0.3 mg/24hr patch, Place onto the skin., Disp: , Rfl:    Elastic Bandages & Supports (RELIEF KNEE) MISC, Apply in the morning and remove at night., Disp: , Rfl:    fluticasone (FLONASE) 50 MCG/ACT nasal spray, Place 2 sprays into both nostrils daily., Disp: 16 g, Rfl: 0   furosemide (LASIX) 40 MG tablet, Take by mouth., Disp: , Rfl:    glimepiride (AMARYL) 4 MG tablet, Take by mouth., Disp: , Rfl:    hydrALAZINE (APRESOLINE) 100 MG tablet, Take 100 mg by mouth 2 (two) times daily., Disp: , Rfl:    hydrochlorothiazide (HYDRODIURIL) 25 MG tablet, Take by mouth., Disp: , Rfl:    Ipratropium-Albuterol (COMBIVENT) 20-100 MCG/ACT AERS respimat, Inhale 1 puff into the lungs every 6 (six) hours as  needed for up to 8 days for shortness of breath., Disp: 4 g, Rfl: 0   losartan (COZAAR) 100 MG tablet, Take by mouth., Disp: , Rfl:    metFORMIN (GLUCOPHAGE-XR) 500 MG 24 hr tablet, metformin ER 500 mg tablet,extended release 24 hr  TAKE 2 TABLETS (1,000 MG TOTAL) BY MOUTH TWO (2) TIMES A DAY., Disp: , Rfl:    metoprolol succinate (TOPROL-XL) 100 MG 24 hr tablet, Take by mouth., Disp: , Rfl:    minoxidil (LONITEN) 10 MG tablet, Take by mouth., Disp: , Rfl:    pantoprazole (PROTONIX) 40 MG tablet, pantoprazole 40 mg tablet,delayed release  TAKE 1 TABLET BY MOUTH EVERY DAY, Disp: , Rfl:    promethazine-dextromethorphan (PROMETHAZINE-DM) 6.25-15 MG/5ML syrup, Take 5 mLs by mouth 4 (four) times daily as needed for cough., Disp: 118 mL, Rfl: 0   traZODone (DESYREL) 50 MG tablet, Take by mouth., Disp: , Rfl:   Social History   Tobacco Use  Smoking Status Never  Smokeless Tobacco Never    No Known Allergies Objective:  There were no vitals filed for this visit. There is no height or weight on file to calculate BMI. Constitutional Well developed. Well nourished.  Vascular Dorsalis pedis pulses palpable bilaterally. Posterior tibial pulses palpable bilaterally. Capillary refill normal to all digits.  No cyanosis or clubbing  noted. Pedal hair growth normal.  Neurologic Normal speech. Oriented to person, place, and time. Epicritic sensation to light touch grossly present bilaterally.  Dermatologic Nails well groomed and normal in appearance. No open wounds. No skin lesions.  Orthopedic: Pain on palpation right second metatarsophalangeal joint pain with range of motion of the second MTPJ joint.  No deep intra-articular pain noted to the second metatarsophalangeal joint.  Negative Mulder's click or neuroma   Radiographs: 3 views of skeletally mature adult right foot: No fractures noted no stress fracture noted.  No osteoarthritic changes noted of the second metatarsophalangeal joint.  Mild  osteoarthritic changes noted of the first metatarsophalangeal joint.  Pes planovalgus deformity noted.  Midfoot arthritis noted. Assessment:   1. Pes planovalgus   2. Capsulitis of metatarsophalangeal (MTP) joint of right foot    Plan:  Patient was evaluated and treated and all questions answered.  Right second metatarsophalangeal joint capsulitis -I explained to the patient the etiology of capsulitis and various treatment options were extensively discussed.  Given the amount of pain that is having I believe would benefit from steroid injection help decrease acute inflammatory component associate with pain.  Patient agrees with plan like to proceed with steroid injection -A steroid injection was performed at right second MTP using 1% plain Lidocaine and 10 mg of Kenalog. This was well tolerated.  Pes planovalgus -I explained to patient the etiology of pes planovalgus and relationship with second metatarsophalangeal joint capsulitis and various treatment options were discussed.  Given patient foot structure in the setting of Planter fasciitis I believe patient will benefit from custom-made orthotics to help control the hindfoot motion support the arch of the foot and take the stress away from plantar fascial.  Patient agrees with the plan like to proceed with orthotics -Patient was casted for orthotics    No follow-ups on file.

## 2021-09-27 ENCOUNTER — Encounter (INDEPENDENT_AMBULATORY_CARE_PROVIDER_SITE_OTHER): Payer: Self-pay

## 2021-09-27 ENCOUNTER — Ambulatory Visit: Payer: BC Managed Care – PPO | Admitting: Podiatry

## 2021-09-27 ENCOUNTER — Other Ambulatory Visit: Payer: Self-pay

## 2021-09-27 DIAGNOSIS — Q666 Other congenital valgus deformities of feet: Secondary | ICD-10-CM | POA: Diagnosis not present

## 2021-09-27 DIAGNOSIS — M7751 Other enthesopathy of right foot: Secondary | ICD-10-CM

## 2021-09-27 NOTE — Progress Notes (Signed)
Subjective:  Patient ID: Bobby Montgomery, male    DOB: 25-May-1981,  MRN: [id number]  Chief Complaint  Patient presents with   Foot Pain    PT stated that the pain is not as bad     40 y.o. male presents with the above complaint.  Patient presents with follow-up of right second metatarsophalangeal joint capsulitis.  He states is doing a lot better the injection helped considerably.  He is also here to pick up his orthotics.  He denies any other acute complaints.  He is about 95% better.   Review of Systems: Negative except as noted in the HPI. Denies N/V/F/Ch.  Past Medical History:  Diagnosis Date   CKD (chronic kidney disease), stage III (HCC)    Hypertension associated with diabetes (HCC)    Type 2 diabetes mellitus (HCC)     Current Outpatient Medications:    allopurinol (ZYLOPRIM) 100 MG tablet, , Disp: , Rfl:    amLODipine (NORVASC) 10 MG tablet, Take by mouth., Disp: , Rfl:    aspirin 81 MG EC tablet, , Disp: , Rfl:    calcitRIOL (ROCALTROL) 0.25 MCG capsule, , Disp: , Rfl:    cloNIDine (CATAPRES - DOSED IN MG/24 HR) 0.3 mg/24hr patch, Place onto the skin., Disp: , Rfl:    Elastic Bandages & Supports (RELIEF KNEE) MISC, Apply in the morning and remove at night., Disp: , Rfl:    fluticasone (FLONASE) 50 MCG/ACT nasal spray, Place 2 sprays into both nostrils daily., Disp: 16 g, Rfl: 0   furosemide (LASIX) 40 MG tablet, Take by mouth., Disp: , Rfl:    glimepiride (AMARYL) 4 MG tablet, Take by mouth., Disp: , Rfl:    hydrALAZINE (APRESOLINE) 100 MG tablet, Take 100 mg by mouth 2 (two) times daily., Disp: , Rfl:    hydrochlorothiazide (HYDRODIURIL) 25 MG tablet, Take by mouth., Disp: , Rfl:    Ipratropium-Albuterol (COMBIVENT) 20-100 MCG/ACT AERS respimat, Inhale 1 puff into the lungs every 6 (six) hours as needed for up to 8 days for shortness of breath., Disp: 4 g, Rfl: 0   losartan (COZAAR) 100 MG tablet, Take by mouth., Disp: , Rfl:    metFORMIN (GLUCOPHAGE-XR) 500 MG 24 hr  tablet, metformin ER 500 mg tablet,extended release 24 hr  TAKE 2 TABLETS (1,000 MG TOTAL) BY MOUTH TWO (2) TIMES A DAY., Disp: , Rfl:    metoprolol succinate (TOPROL-XL) 100 MG 24 hr tablet, Take by mouth., Disp: , Rfl:    minoxidil (LONITEN) 10 MG tablet, Take by mouth., Disp: , Rfl:    pantoprazole (PROTONIX) 40 MG tablet, pantoprazole 40 mg tablet,delayed release  TAKE 1 TABLET BY MOUTH EVERY DAY, Disp: , Rfl:    promethazine-dextromethorphan (PROMETHAZINE-DM) 6.25-15 MG/5ML syrup, Take 5 mLs by mouth 4 (four) times daily as needed for cough., Disp: 118 mL, Rfl: 0   traZODone (DESYREL) 50 MG tablet, Take by mouth., Disp: , Rfl:   Social History   Tobacco Use  Smoking Status Never  Smokeless Tobacco Never    No Known Allergies Objective:  There were no vitals filed for this visit. There is no height or weight on file to calculate BMI. Constitutional Well developed. Well nourished.  Vascular Dorsalis pedis pulses palpable bilaterally. Posterior tibial pulses palpable bilaterally. Capillary refill normal to all digits.  No cyanosis or clubbing noted. Pedal hair growth normal.  Neurologic Normal speech. Oriented to person, place, and time. Epicritic sensation to light touch grossly present bilaterally.  Dermatologic Nails well groomed and  normal in appearance. No open wounds. No skin lesions.  Orthopedic: No further pain on palpation right second metatarsophalangeal joint pain with range of motion of the second MTPJ joint.  No deep intra-articular pain noted to the second metatarsophalangeal joint.  Negative Mulder's click or neuroma   Radiographs: 3 views of skeletally mature adult right foot: No fractures noted no stress fracture noted.  No osteoarthritic changes noted of the second metatarsophalangeal joint.  Mild osteoarthritic changes noted of the first metatarsophalangeal joint.  Pes planovalgus deformity noted.  Midfoot arthritis noted. Assessment:   1. Pes planovalgus    2. Capsulitis of metatarsophalangeal (MTP) joint of right foot     Plan:  Patient was evaluated and treated and all questions answered.  Right second metatarsophalangeal joint capsulitis Clinically resolved with 1 steroid injection.  I discussed shoe gear modification and orthotics in extensive detail.  He states understanding.  Pes planovalgus -I explained to patient the etiology of pes planovalgus and relationship with second metatarsophalangeal joint capsulitis and various treatment options were discussed.  Given patient foot structure in the setting of Planter fasciitis I believe patient will benefit from custom-made orthotics to help control the hindfoot motion support the arch of the foot and take the stress away from plantar fascial.  Patient agrees with the plan like to proceed with orthotics -Orthotics were dispensed functioning well and correcting and offloading submetatarsal.    No follow-ups on file.

## 2023-09-04 ENCOUNTER — Encounter: Payer: Self-pay | Admitting: Podiatry

## 2023-09-04 ENCOUNTER — Ambulatory Visit: Payer: Commercial Managed Care - PPO | Admitting: Podiatry

## 2023-09-04 VITALS — BP 135/80 | HR 66

## 2023-09-04 DIAGNOSIS — L600 Ingrowing nail: Secondary | ICD-10-CM

## 2023-09-04 NOTE — Progress Notes (Signed)
Subjective:  Patient ID: Bobby Montgomery, male    DOB: [birth year],  MRN: [id number]  Chief Complaint  Patient presents with   Diabetes    "I guess he's going to remove the big toenail.  I'm Diabetic."    42 y.o. male presents with the above complaint.  Patient presents with right lateral border ingrown painful to touch is progressive and worse worse with ambulation worse with pressure patient is a diabetic last A1c of 5.5.  He has not seen MRIs prior to seeing me denies any other acute complaints   Review of Systems: Negative except as noted in the HPI. Denies N/V/F/Ch.  Past Medical History:  Diagnosis Date   CKD (chronic kidney disease), stage III (HCC)    Hypertension associated with diabetes (HCC)    Type 2 diabetes mellitus (HCC)     Current Outpatient Medications:    allopurinol (ZYLOPRIM) 100 MG tablet, , Disp: , Rfl:    amLODipine (NORVASC) 10 MG tablet, Take by mouth., Disp: , Rfl:    aspirin 81 MG EC tablet, , Disp: , Rfl:    calcitRIOL (ROCALTROL) 0.25 MCG capsule, , Disp: , Rfl:    cloNIDine (CATAPRES - DOSED IN MG/24 HR) 0.3 mg/24hr patch, Place onto the skin., Disp: , Rfl:    Elastic Bandages & Supports (RELIEF KNEE) MISC, Apply in the morning and remove at night., Disp: , Rfl:    fluticasone (FLONASE) 50 MCG/ACT nasal spray, Place 2 sprays into both nostrils daily., Disp: 16 g, Rfl: 0   hydrALAZINE (APRESOLINE) 100 MG tablet, Take 100 mg by mouth 2 (two) times daily., Disp: , Rfl:    metFORMIN (GLUCOPHAGE-XR) 500 MG 24 hr tablet, metformin ER 500 mg tablet,extended release 24 hr  TAKE 2 TABLETS (1,000 MG TOTAL) BY MOUTH TWO (2) TIMES A DAY., Disp: , Rfl:    metoprolol succinate (TOPROL-XL) 100 MG 24 hr tablet, Take by mouth., Disp: , Rfl:    pantoprazole (PROTONIX) 40 MG tablet, pantoprazole 40 mg tablet,delayed release  TAKE 1 TABLET BY MOUTH EVERY DAY, Disp: , Rfl:    traZODone (DESYREL) 50 MG tablet, Take by mouth., Disp: , Rfl:    furosemide (LASIX) 40 MG tablet,  Take by mouth., Disp: , Rfl:    glimepiride (AMARYL) 4 MG tablet, Take by mouth., Disp: , Rfl:    hydrochlorothiazide (HYDRODIURIL) 25 MG tablet, Take by mouth., Disp: , Rfl:    Ipratropium-Albuterol (COMBIVENT) 20-100 MCG/ACT AERS respimat, Inhale 1 puff into the lungs every 6 (six) hours as needed for up to 8 days for shortness of breath., Disp: 4 g, Rfl: 0   losartan (COZAAR) 100 MG tablet, Take by mouth., Disp: , Rfl:    minoxidil (LONITEN) 10 MG tablet, Take by mouth., Disp: , Rfl:    promethazine-dextromethorphan (PROMETHAZINE-DM) 6.25-15 MG/5ML syrup, Take 5 mLs by mouth 4 (four) times daily as needed for cough. (Patient not taking: Reported on 09/04/2023), Disp: 118 mL, Rfl: 0  Social History   Tobacco Use  Smoking Status Never  Smokeless Tobacco Never    No Known Allergies Objective:   Vitals:   09/04/23 1047  BP: 135/80  Pulse: 66   There is no height or weight on file to calculate BMI. Constitutional Well developed. Well nourished.  Vascular Dorsalis pedis pulses palpable bilaterally. Posterior tibial pulses palpable bilaterally. Capillary refill normal to all digits.  No cyanosis or clubbing noted. Pedal hair growth normal.  Neurologic Normal speech. Oriented to person, place, and time. Epicritic sensation to  light touch grossly present bilaterally.  Dermatologic Painful ingrowing nail at lateral nail borders of the hallux nail right. No other open wounds. No skin lesions.  Orthopedic: Normal joint ROM without pain or crepitus bilaterally. No visible deformities. No bony tenderness.   Radiographs: None Assessment:   1. Ingrown toenail of right foot    Plan:  Patient was evaluated and treated and all questions answered.  Ingrown Nail, right -Patient elects to proceed with minor surgery to remove ingrown toenail removal today. Consent reviewed and signed by patient. -Ingrown nail excised. See procedure note. -Educated on post-procedure care including  soaking. Written instructions provided and reviewed. -Patient to follow up in 2 weeks for nail check.  Procedure: Excision of Ingrown Toenail Location: Right 1st toe lateral nail borders. Anesthesia: Lidocaine 1% plain; 1.5 mL and Marcaine 0.5% plain; 1.5 mL, digital block. Skin Prep: Betadine. Dressing: Silvadene; telfa; dry, sterile, compression dressing. Technique: Following skin prep, the toe was exsanguinated and a tourniquet was secured at the base of the toe. The affected nail border was freed, split with a nail splitter, and excised. Chemical matrixectomy was then performed with phenol and irrigated out with alcohol. The tourniquet was then removed and sterile dressing applied. Disposition: Patient tolerated procedure well. Patient to return in 2 weeks for follow-up.   No follow-ups on file.
